# Patient Record
Sex: Female | Born: 1955
Health system: Southern US, Community
[De-identification: ages and names within clinical notes are randomized; demographics above are authoritative.]

## PROBLEM LIST (undated history)

## (undated) DIAGNOSIS — Z860100 Personal history of colon polyps, unspecified: Secondary | ICD-10-CM

## (undated) DIAGNOSIS — K219 Gastro-esophageal reflux disease without esophagitis: Secondary | ICD-10-CM

## (undated) DIAGNOSIS — K8020 Calculus of gallbladder without cholecystitis without obstruction: Secondary | ICD-10-CM

## (undated) DIAGNOSIS — F419 Anxiety disorder, unspecified: Secondary | ICD-10-CM

## (undated) DIAGNOSIS — Z800 Family history of malignant neoplasm of digestive organs: Secondary | ICD-10-CM

## (undated) DIAGNOSIS — N390 Urinary tract infection, site not specified: Secondary | ICD-10-CM

## (undated) DIAGNOSIS — K589 Irritable bowel syndrome without diarrhea: Secondary | ICD-10-CM

## (undated) DIAGNOSIS — E739 Lactose intolerance, unspecified: Secondary | ICD-10-CM

## (undated) DIAGNOSIS — Z8041 Family history of malignant neoplasm of ovary: Secondary | ICD-10-CM

## (undated) DIAGNOSIS — E039 Hypothyroidism, unspecified: Secondary | ICD-10-CM

## (undated) HISTORY — DX: Family history of malignant neoplasm of ovary: Z80.41

## (undated) HISTORY — DX: Personal history of colonic polyps: Z86.010

## (undated) HISTORY — DX: Hypothyroidism, unspecified: E03.9

## (undated) HISTORY — PX: ABDOMINAL HYSTERECTOMY: SHX81

## (undated) HISTORY — DX: Anxiety disorder, unspecified: F41.9

## (undated) HISTORY — PX: INGUINAL HERNIA REPAIR: SUR1180

## (undated) HISTORY — DX: Gastro-esophageal reflux disease without esophagitis: K21.9

## (undated) HISTORY — DX: Irritable bowel syndrome, unspecified: K58.9

## (undated) HISTORY — DX: Urinary tract infection, site not specified: N39.0

## (undated) HISTORY — DX: Lactose intolerance, unspecified: E73.9

## (undated) HISTORY — DX: Calculus of gallbladder without cholecystitis without obstruction: K80.20

## (undated) HISTORY — PX: CHOLECYSTECTOMY: SHX55

## (undated) HISTORY — DX: Family history of malignant neoplasm of digestive organs: Z80.0

## (undated) HISTORY — DX: Personal history of colon polyps, unspecified: Z86.0100

---

## 1983-05-05 HISTORY — PX: PARTIAL HYSTERECTOMY: SHX80

## 1997-05-04 HISTORY — PX: RIGHT OOPHORECTOMY: SHX2359

## 1997-06-28 ENCOUNTER — Ambulatory Visit (HOSPITAL_COMMUNITY): Admission: RE | Admit: 1997-06-28 | Discharge: 1997-06-28 | Payer: Self-pay | Admitting: Obstetrics & Gynecology

## 1998-06-13 ENCOUNTER — Other Ambulatory Visit: Admission: RE | Admit: 1998-06-13 | Discharge: 1998-06-13 | Payer: Self-pay | Admitting: Obstetrics & Gynecology

## 2002-07-14 ENCOUNTER — Other Ambulatory Visit: Admission: RE | Admit: 2002-07-14 | Discharge: 2002-07-14 | Payer: Self-pay | Admitting: Obstetrics & Gynecology

## 2003-07-19 ENCOUNTER — Other Ambulatory Visit: Admission: RE | Admit: 2003-07-19 | Discharge: 2003-07-19 | Payer: Self-pay | Admitting: Obstetrics & Gynecology

## 2004-07-29 ENCOUNTER — Other Ambulatory Visit: Admission: RE | Admit: 2004-07-29 | Discharge: 2004-07-29 | Payer: Self-pay | Admitting: Obstetrics & Gynecology

## 2005-08-17 ENCOUNTER — Other Ambulatory Visit: Admission: RE | Admit: 2005-08-17 | Discharge: 2005-08-17 | Payer: Self-pay | Admitting: Obstetrics & Gynecology

## 2010-04-29 ENCOUNTER — Encounter (INDEPENDENT_AMBULATORY_CARE_PROVIDER_SITE_OTHER): Payer: Self-pay | Admitting: *Deleted

## 2010-05-01 ENCOUNTER — Encounter (INDEPENDENT_AMBULATORY_CARE_PROVIDER_SITE_OTHER): Payer: Self-pay | Admitting: *Deleted

## 2010-05-06 ENCOUNTER — Ambulatory Visit
Admission: RE | Admit: 2010-05-06 | Discharge: 2010-05-06 | Payer: Self-pay | Source: Home / Self Care | Attending: Gastroenterology | Admitting: Gastroenterology

## 2010-06-05 NOTE — Letter (Signed)
Summary: Moviprep Instructions  [facility]  [address]   [place], [place] [address]   Phone: [phone]  Fax: [phone]       DAPHYNE MIGUEZ    1955-11-26    MRN: [id number]        Procedure Day Dorna Bloom: Duanne Limerick  06/16/10     Arrival Time: 8:00 a.m.      Procedure Time: 9:00 a.m.     Location of Procedure:                    x   [facility] (4th Floor)   PREPARATION FOR COLONOSCOPY WITH MOVIPREP   Starting 5 days prior to your procedure  06/11/10 do not eat nuts, seeds, popcorn, corn, beans, peas,  salads, or any raw vegetables.  Do not take any fiber supplements (e.g. Metamucil, Citrucel, and Benefiber).  THE DAY BEFORE YOUR PROCEDURE         DATE: 06/15/10   DAY: SUNDAY  1.  Drink clear liquids the entire day-NO SOLID FOOD  2.  Do not drink anything colored red or purple.  Avoid juices with pulp.  No orange juice.  3.  Drink at least 64 oz. (8 glasses) of fluid/clear liquids during the day to prevent dehydration and help the prep work efficiently.  CLEAR LIQUIDS INCLUDE: Water Jello Ice Popsicles Tea (sugar ok, no milk/cream) Powdered fruit flavored drinks Coffee (sugar ok, no milk/cream) Gatorade Juice: apple, white grape, white cranberry  Lemonade Clear bullion, consomm, broth Carbonated beverages (any kind) Strained chicken noodle soup Hard Candy                             4.  In the morning, mix first dose of MoviPrep solution:    Empty 1 Pouch A and 1 Pouch B into the disposable container    Add lukewarm drinking water to the top line of the container. Mix to dissolve    Refrigerate (mixed solution should be used within 24 hrs)  5.  Begin drinking the prep at 5:00 p.m. The MoviPrep container is divided by 4 marks.   Every 15 minutes drink the solution down to the next mark (approximately 8 oz) until the full liter is complete.   6.  Follow completed prep with 16 oz of clear liquid of your choice (Nothing red or  purple).  Continue to drink clear liquids until bedtime.  7.  Before going to bed, mix second dose of MoviPrep solution:    Empty 1 Pouch A and 1 Pouch B into the disposable container    Add lukewarm drinking water to the top line of the container. Mix to dissolve    Refrigerate  THE DAY OF YOUR PROCEDURE      DATE: 06/16/10  DAY: MONDAY  Beginning at 4:00a.m. (5 hours before procedure):         1. Every 15 minutes, drink the solution down to the next mark (approx 8 oz) until the full liter is complete.  2. Follow completed prep with 16 oz. of clear liquid of your choice.    3. You may drink clear liquids until 7:00a.m. (2 HOURS BEFORE PROCEDURE).   MEDICATION INSTRUCTIONS  Unless otherwise instructed, you should take regular prescription medications with a small sip of water   as early as possible the morning of your procedure.       OTHER INSTRUCTIONS  You will need a responsible adult at  least 55 years of age to accompany you and drive you home.   This person must remain in the waiting room during your procedure.  Wear loose fitting clothing that is easily removed.  Leave jewelry and other valuables at home.  However, you may wish to bring a book to read or  an iPod/MP3 player to listen to music as you wait for your procedure to start.  Remove all body piercing jewelry and leave at home.  Total time from sign-in until discharge is approximately 2-3 hours.  You should go home directly after your procedure and rest.  You can resume normal activities the  day after your procedure.  The day of your procedure you should not:   Drive   Make legal decisions   Operate machinery   Drink alcohol   Return to work  You will receive specific instructions about eating, activities and medications before you leave.    The above instructions have been reviewed and explained to me by   Wyona Almas RN  May 06, 2010 10:58 AM     I fully understand and can  verbalize these instructions _____________________________ Date _________

## 2010-06-05 NOTE — Miscellaneous (Signed)
Summary: [facility] Previsit/prep  Clinical Lists Changes  Medications: Added new medication of MOVIPREP 100 GM  SOLR (PEG-KCL-NACL-NASULF-NA ASC-C) As per prep instructions. - Signed Rx of MOVIPREP 100 GM  SOLR (PEG-KCL-NACL-NASULF-NA ASC-C) As per prep instructions.;  #1 x 0;  Signed;  Entered by: Wyona Almas RN;  Authorized by: Meryl Dare MD [facility];  Method used: Electron[facility] to [facility] #[id number] [address] [place], [place]  [address], Ph: [phone], Fax: [phone] Allergies: Added new allergy or adverse reaction of CODEINE Observations: Added new observation of NKA: F (05/06/2010 10:18)    Prescriptions: MOVIPREP 100 GM  SOLR (PEG-KCL-NACL-NASULF-NA ASC-C) As per prep instructions.  #1 x 0   Entered by:   Wyona Almas RN   Authorized by:   Meryl Dare MD [facility]   Signed by:   Wyona Almas RN on 05/06/2010   Method used:   [facility]ronically to        [id number] (retail)       [address]       [place], [place]  [address]       Ph: [phone]       Fax: [phone]   RxID:   [id number]

## 2010-06-05 NOTE — Letter (Signed)
Summary: Pre Visit Letter Revised  [facility]  [address] [place], [place] [address]   Phone: [phone]  Fax: [phone]        04/29/2010 MRN: [id number] BREALYNN CONTINO [address] [place], [place]  [address]             Procedure Date:  May 20, 2010   Welcome to the Gastroenterology Division at [facility].    You are scheduled to see a nurse for your pre-procedure visit on May 06, 2010 at 10:30am on the 3rd floor at [facility], [address]  We ask that you try to arrive at our office 15 minutes prior to your appointment time to allow for check-in.  Please take a minute to review the attached form.  If you answer "Yes" to one or more of the questions on the first page, we ask that you call the person listed at your earliest opportunity.  If you answer "No" to all of the questions, please complete the rest of the form and bring it to your appointment.    Your nurse visit will consist of discussing your medical and surgical history, your immediate family medical history, and your medications.   If you are unable to list all of your medications on the form, please bring the medication bottles to your appointment and we will list them.  We will need to be aware of both prescribed and over the counter drugs.  We will need to know exact dosage information as well.    Please be prepared to read and sign documents such as consent forms, a financial agreement, and acknowledgement forms.  If necessary, and with your consent, a friend or relative is welcome to sit-in on the nurse visit with you.  Please bring your insurance card so that we may make a copy of it.  If your insurance requires a referral to see a specialist, please bring your referral form from your primary care physician.  No co-pay is required for this nurse visit.     If you cannot keep your appointment, please call [phone] to cancel or reschedule prior to your appointment  date.  This allows [place] the opportunity to schedule an appointment for another patient in need of care.    Thank you for choosing [facility] for your medical needs.  We appreciate the opportunity to care for you.  Please visit [place] at our website  to learn more about our practice.  Sincerely, The Gastroenterology Division

## 2011-07-22 ENCOUNTER — Institutional Professional Consult (permissible substitution): Payer: Self-pay | Admitting: Cardiology

## 2011-08-17 ENCOUNTER — Institutional Professional Consult (permissible substitution): Payer: Self-pay | Admitting: Cardiovascular Disease

## 2014-06-25 HISTORY — PX: COLONOSCOPY: SHX174

## 2015-10-02 DIAGNOSIS — Z01419 Encounter for gynecological examination (general) (routine) without abnormal findings: Secondary | ICD-10-CM | POA: Diagnosis not present

## 2015-10-02 DIAGNOSIS — Z13820 Encounter for screening for osteoporosis: Secondary | ICD-10-CM | POA: Diagnosis not present

## 2015-10-02 DIAGNOSIS — Z6826 Body mass index (BMI) 26.0-26.9, adult: Secondary | ICD-10-CM | POA: Diagnosis not present

## 2015-10-02 DIAGNOSIS — Z1231 Encounter for screening mammogram for malignant neoplasm of breast: Secondary | ICD-10-CM | POA: Diagnosis not present

## 2015-10-24 DIAGNOSIS — R590 Localized enlarged lymph nodes: Secondary | ICD-10-CM | POA: Diagnosis not present

## 2015-10-24 DIAGNOSIS — M79604 Pain in right leg: Secondary | ICD-10-CM | POA: Diagnosis not present

## 2015-10-24 DIAGNOSIS — R609 Edema, unspecified: Secondary | ICD-10-CM | POA: Diagnosis not present

## 2015-10-24 DIAGNOSIS — K4091 Unilateral inguinal hernia, without obstruction or gangrene, recurrent: Secondary | ICD-10-CM | POA: Diagnosis not present

## 2016-01-14 DIAGNOSIS — M1612 Unilateral primary osteoarthritis, left hip: Secondary | ICD-10-CM | POA: Diagnosis not present

## 2016-01-14 DIAGNOSIS — Z0000 Encounter for general adult medical examination without abnormal findings: Secondary | ICD-10-CM | POA: Diagnosis not present

## 2016-01-14 DIAGNOSIS — Z0001 Encounter for general adult medical examination with abnormal findings: Secondary | ICD-10-CM | POA: Diagnosis not present

## 2016-01-14 DIAGNOSIS — Z1389 Encounter for screening for other disorder: Secondary | ICD-10-CM | POA: Diagnosis not present

## 2016-03-12 DIAGNOSIS — H43812 Vitreous degeneration, left eye: Secondary | ICD-10-CM | POA: Diagnosis not present

## 2016-06-29 DIAGNOSIS — M542 Cervicalgia: Secondary | ICD-10-CM | POA: Diagnosis not present

## 2016-06-29 DIAGNOSIS — R42 Dizziness and giddiness: Secondary | ICD-10-CM | POA: Diagnosis not present

## 2016-08-20 DIAGNOSIS — R1013 Epigastric pain: Secondary | ICD-10-CM | POA: Diagnosis not present

## 2016-08-20 DIAGNOSIS — Z6825 Body mass index (BMI) 25.0-25.9, adult: Secondary | ICD-10-CM | POA: Diagnosis not present

## 2016-08-20 DIAGNOSIS — K210 Gastro-esophageal reflux disease with esophagitis: Secondary | ICD-10-CM | POA: Diagnosis not present

## 2016-10-29 DIAGNOSIS — Z6826 Body mass index (BMI) 26.0-26.9, adult: Secondary | ICD-10-CM | POA: Diagnosis not present

## 2016-10-29 DIAGNOSIS — N760 Acute vaginitis: Secondary | ICD-10-CM | POA: Diagnosis not present

## 2016-10-29 DIAGNOSIS — Z1231 Encounter for screening mammogram for malignant neoplasm of breast: Secondary | ICD-10-CM | POA: Diagnosis not present

## 2016-10-29 DIAGNOSIS — Z01419 Encounter for gynecological examination (general) (routine) without abnormal findings: Secondary | ICD-10-CM | POA: Diagnosis not present

## 2017-01-05 DIAGNOSIS — Z6826 Body mass index (BMI) 26.0-26.9, adult: Secondary | ICD-10-CM | POA: Diagnosis not present

## 2017-01-05 DIAGNOSIS — K219 Gastro-esophageal reflux disease without esophagitis: Secondary | ICD-10-CM | POA: Diagnosis not present

## 2017-01-05 DIAGNOSIS — L509 Urticaria, unspecified: Secondary | ICD-10-CM | POA: Diagnosis not present

## 2017-01-07 DIAGNOSIS — K219 Gastro-esophageal reflux disease without esophagitis: Secondary | ICD-10-CM | POA: Diagnosis not present

## 2017-01-07 DIAGNOSIS — Z23 Encounter for immunization: Secondary | ICD-10-CM | POA: Diagnosis not present

## 2017-01-07 DIAGNOSIS — L501 Idiopathic urticaria: Secondary | ICD-10-CM | POA: Diagnosis not present

## 2017-01-07 DIAGNOSIS — E039 Hypothyroidism, unspecified: Secondary | ICD-10-CM | POA: Diagnosis not present

## 2017-02-23 DIAGNOSIS — R1012 Left upper quadrant pain: Secondary | ICD-10-CM | POA: Diagnosis not present

## 2017-02-23 DIAGNOSIS — K219 Gastro-esophageal reflux disease without esophagitis: Secondary | ICD-10-CM | POA: Diagnosis not present

## 2017-03-03 DIAGNOSIS — K449 Diaphragmatic hernia without obstruction or gangrene: Secondary | ICD-10-CM | POA: Diagnosis not present

## 2017-03-03 DIAGNOSIS — K2900 Acute gastritis without bleeding: Secondary | ICD-10-CM | POA: Diagnosis not present

## 2017-03-03 DIAGNOSIS — Z79899 Other long term (current) drug therapy: Secondary | ICD-10-CM | POA: Diagnosis not present

## 2017-03-03 DIAGNOSIS — K219 Gastro-esophageal reflux disease without esophagitis: Secondary | ICD-10-CM | POA: Diagnosis not present

## 2017-03-03 DIAGNOSIS — D131 Benign neoplasm of stomach: Secondary | ICD-10-CM | POA: Diagnosis not present

## 2017-03-03 DIAGNOSIS — K2950 Unspecified chronic gastritis without bleeding: Secondary | ICD-10-CM | POA: Diagnosis not present

## 2017-03-03 DIAGNOSIS — K317 Polyp of stomach and duodenum: Secondary | ICD-10-CM | POA: Diagnosis not present

## 2017-03-03 DIAGNOSIS — R1012 Left upper quadrant pain: Secondary | ICD-10-CM | POA: Diagnosis not present

## 2017-03-03 DIAGNOSIS — Z9049 Acquired absence of other specified parts of digestive tract: Secondary | ICD-10-CM | POA: Diagnosis not present

## 2017-03-03 DIAGNOSIS — R1013 Epigastric pain: Secondary | ICD-10-CM | POA: Diagnosis not present

## 2017-03-03 DIAGNOSIS — K581 Irritable bowel syndrome with constipation: Secondary | ICD-10-CM | POA: Diagnosis not present

## 2017-03-03 DIAGNOSIS — K209 Esophagitis, unspecified: Secondary | ICD-10-CM | POA: Diagnosis not present

## 2017-03-03 DIAGNOSIS — E039 Hypothyroidism, unspecified: Secondary | ICD-10-CM | POA: Diagnosis not present

## 2017-03-03 DIAGNOSIS — B9681 Helicobacter pylori [H. pylori] as the cause of diseases classified elsewhere: Secondary | ICD-10-CM | POA: Diagnosis not present

## 2017-03-03 HISTORY — PX: ESOPHAGOGASTRODUODENOSCOPY: SHX1529

## 2017-05-24 DIAGNOSIS — M25551 Pain in right hip: Secondary | ICD-10-CM | POA: Diagnosis not present

## 2017-05-24 DIAGNOSIS — M79671 Pain in right foot: Secondary | ICD-10-CM | POA: Diagnosis not present

## 2017-05-24 DIAGNOSIS — Z6826 Body mass index (BMI) 26.0-26.9, adult: Secondary | ICD-10-CM | POA: Diagnosis not present

## 2017-06-08 DIAGNOSIS — M545 Low back pain: Secondary | ICD-10-CM | POA: Diagnosis not present

## 2017-06-08 DIAGNOSIS — M25551 Pain in right hip: Secondary | ICD-10-CM | POA: Diagnosis not present

## 2017-06-11 DIAGNOSIS — M25651 Stiffness of right hip, not elsewhere classified: Secondary | ICD-10-CM | POA: Diagnosis not present

## 2017-06-11 DIAGNOSIS — R2689 Other abnormalities of gait and mobility: Secondary | ICD-10-CM | POA: Diagnosis not present

## 2017-06-11 DIAGNOSIS — M25551 Pain in right hip: Secondary | ICD-10-CM | POA: Diagnosis not present

## 2017-06-29 DIAGNOSIS — R1031 Right lower quadrant pain: Secondary | ICD-10-CM | POA: Diagnosis not present

## 2017-06-29 DIAGNOSIS — M25551 Pain in right hip: Secondary | ICD-10-CM | POA: Diagnosis not present

## 2017-07-05 DIAGNOSIS — R1031 Right lower quadrant pain: Secondary | ICD-10-CM | POA: Diagnosis not present

## 2017-07-05 DIAGNOSIS — M25551 Pain in right hip: Secondary | ICD-10-CM | POA: Diagnosis not present

## 2017-11-30 DIAGNOSIS — Z6826 Body mass index (BMI) 26.0-26.9, adult: Secondary | ICD-10-CM | POA: Diagnosis not present

## 2017-11-30 DIAGNOSIS — Z1231 Encounter for screening mammogram for malignant neoplasm of breast: Secondary | ICD-10-CM | POA: Diagnosis not present

## 2017-11-30 DIAGNOSIS — N390 Urinary tract infection, site not specified: Secondary | ICD-10-CM | POA: Diagnosis not present

## 2017-11-30 DIAGNOSIS — B373 Candidiasis of vulva and vagina: Secondary | ICD-10-CM | POA: Diagnosis not present

## 2017-11-30 DIAGNOSIS — Z01419 Encounter for gynecological examination (general) (routine) without abnormal findings: Secondary | ICD-10-CM | POA: Diagnosis not present

## 2017-11-30 DIAGNOSIS — Z13820 Encounter for screening for osteoporosis: Secondary | ICD-10-CM | POA: Diagnosis not present

## 2017-12-02 DIAGNOSIS — M26629 Arthralgia of temporomandibular joint, unspecified side: Secondary | ICD-10-CM | POA: Diagnosis not present

## 2017-12-02 DIAGNOSIS — Z6826 Body mass index (BMI) 26.0-26.9, adult: Secondary | ICD-10-CM | POA: Diagnosis not present

## 2017-12-11 DIAGNOSIS — M545 Low back pain, unspecified: Secondary | ICD-10-CM | POA: Insufficient documentation

## 2017-12-11 DIAGNOSIS — M25551 Pain in right hip: Secondary | ICD-10-CM | POA: Diagnosis not present

## 2018-01-18 DIAGNOSIS — Z0001 Encounter for general adult medical examination with abnormal findings: Secondary | ICD-10-CM | POA: Diagnosis not present

## 2018-01-18 DIAGNOSIS — Z23 Encounter for immunization: Secondary | ICD-10-CM | POA: Diagnosis not present

## 2018-01-18 DIAGNOSIS — Z13220 Encounter for screening for lipoid disorders: Secondary | ICD-10-CM | POA: Diagnosis not present

## 2018-01-18 DIAGNOSIS — Z1331 Encounter for screening for depression: Secondary | ICD-10-CM | POA: Diagnosis not present

## 2018-01-18 DIAGNOSIS — N3289 Other specified disorders of bladder: Secondary | ICD-10-CM | POA: Diagnosis not present

## 2018-06-27 DIAGNOSIS — J40 Bronchitis, not specified as acute or chronic: Secondary | ICD-10-CM | POA: Diagnosis not present

## 2018-06-27 DIAGNOSIS — J329 Chronic sinusitis, unspecified: Secondary | ICD-10-CM | POA: Diagnosis not present

## 2018-09-19 DIAGNOSIS — F4322 Adjustment disorder with anxiety: Secondary | ICD-10-CM | POA: Diagnosis not present

## 2018-09-19 DIAGNOSIS — Z6826 Body mass index (BMI) 26.0-26.9, adult: Secondary | ICD-10-CM | POA: Diagnosis not present

## 2018-09-19 DIAGNOSIS — N3000 Acute cystitis without hematuria: Secondary | ICD-10-CM | POA: Diagnosis not present

## 2018-10-13 DIAGNOSIS — R635 Abnormal weight gain: Secondary | ICD-10-CM | POA: Diagnosis not present

## 2018-10-13 DIAGNOSIS — F4322 Adjustment disorder with anxiety: Secondary | ICD-10-CM | POA: Diagnosis not present

## 2018-10-13 DIAGNOSIS — N3000 Acute cystitis without hematuria: Secondary | ICD-10-CM | POA: Diagnosis not present

## 2018-10-13 DIAGNOSIS — B373 Candidiasis of vulva and vagina: Secondary | ICD-10-CM | POA: Diagnosis not present

## 2019-01-27 ENCOUNTER — Encounter: Payer: Self-pay | Admitting: Gastroenterology

## 2019-02-14 ENCOUNTER — Encounter: Payer: Self-pay | Admitting: Gastroenterology

## 2019-02-22 ENCOUNTER — Encounter: Payer: Self-pay | Admitting: Gastroenterology

## 2019-02-22 ENCOUNTER — Other Ambulatory Visit: Payer: Self-pay

## 2019-02-22 ENCOUNTER — Ambulatory Visit: Payer: 59 | Admitting: Gastroenterology

## 2019-02-22 VITALS — BP 116/80 | HR 87 | Temp 98.1°F | Ht 64.5 in | Wt 149.5 lb

## 2019-02-22 DIAGNOSIS — R1319 Other dysphagia: Secondary | ICD-10-CM

## 2019-02-22 DIAGNOSIS — R1310 Dysphagia, unspecified: Secondary | ICD-10-CM

## 2019-02-22 DIAGNOSIS — R1011 Right upper quadrant pain: Secondary | ICD-10-CM

## 2019-02-22 DIAGNOSIS — R1012 Left upper quadrant pain: Secondary | ICD-10-CM | POA: Diagnosis not present

## 2019-02-22 DIAGNOSIS — K219 Gastro-esophageal reflux disease without esophagitis: Secondary | ICD-10-CM | POA: Diagnosis not present

## 2019-02-22 DIAGNOSIS — Z800 Family history of malignant neoplasm of digestive organs: Secondary | ICD-10-CM

## 2019-02-22 MED ORDER — PANTOPRAZOLE SODIUM 40 MG PO TBEC: 40.0000 mg | DELAYED_RELEASE_TABLET | Freq: Every day | ORAL | 11 refills | Status: DC

## 2019-02-22 NOTE — Progress Notes (Signed)
Chief Complaint: FU  Referring Provider:  Serita Grammes, MD      ASSESSMENT AND PLAN;   #1. GERD with eso dysphagia/hoarseness. D/d includes eso stricture, Schatzki's ring, motility disorder, eosinophilic esophagitis, pill induced esophagitis, r/o esophageal carcinoma or extrinsic lesions. #2. RUQ/LUQ pain #3. FH colon cancer in a second-degree relative (GM age 63)  Plan: -Protonix 40 mg p.o. QD, 1/2 hr before breakfast. #30.  11 refills. -Barium swallow with barium tablet. -CBC, CMP, lipase (patient wants to get it done at [facility] 6' blocks -EGD with eso bx and possibly dil/ colon Jan 2021  Discussed risks and benefits. -I have instructed patient to chew foods especially meats and breads well and eat slowly. -If still with problems and the above WU is negative, proceed with CT scan abdo/pelvis. -If still continues to have problems with hoarseness, ENT consultation.  HPI:    Cindy Ortiz is a 63 y.o. female  Has been having dysphagia x 1 year, progressively getting worse.  Initially started with solids now also with liquids.  This is associated with hoarseness.  No nausea or self-induced vomiting.  She has been using over-the-counter Prilosec but still has heartburn.  No melena or hematochezia.  No fever chills or night sweats.  No weight loss.  Also having some left upper quadrant discomfort and occasional right upper quadrant discomfort.  Could not identify any definite exacerbating or relieving factors except that bowel movements would relieve left upper quadrant abdominal discomfort at times.  Also complains of occasional constipation occ rectal bleed attributed to hemorrhoids.  Denies any use of nonsteroidals.  Past GI procedures: -EGD 02/2017 small HH.  Incidental gastric polyps s/p polypectomy. Neg eso bx for EoE, hyperplastic. Pos HP -colon 06/2014 (PCF) mild div.  SH-works as a Statistician.  2 boys. Past Medical  History:  Diagnosis Date  . Gallstones   . GERD (gastroesophageal reflux disease)   . History of colon polyps   . Hypothyroidism   . IBS (irritable bowel syndrome)    w/ Constipation  . Lactose intolerance   . UTI (urinary tract infection)     Past Surgical History:  Procedure Laterality Date  . CHOLECYSTECTOMY    . COLONOSCOPY  06/25/2014   Mild diverticulosis. Otherwise normal colonoscopy to TI.   Marland Kitchen ESOPHAGOGASTRODUODENOSCOPY  03/03/2017   Small transient hiatal hernia. Gastritis. Incidental polyps status post polypectomy.   . INGUINAL HERNIA REPAIR    . PARTIAL HYSTERECTOMY    . RIGHT OOPHORECTOMY  05/1997    Family History  Problem Relation Age of Onset  . Ovarian cancer Maternal Grandmother   . Diverticulitis Brother   . Colon cancer Neg Hx   . Esophageal cancer Neg Hx     Social History   Tobacco Use  . Smoking status: Never Smoker  . Smokeless tobacco: Never Used  Substance Use Topics  . Alcohol use: Not Currently  . Drug use: Not Currently    Current Outpatient Medications  Medication Sig Dispense Refill  . ALPRAZolam (XANAX) 0.5 MG tablet Take 0.5 mg by mouth as needed.    Marland Kitchen estradiol (ESTRACE) 1 MG tablet Take 1 mg by mouth daily.    Marland Kitchen levothyroxine (SYNTHROID) 75 MCG tablet 1 tablet daily.     No current facility-administered medications for this visit.     Allergies  Allergen Reactions  . Codeine     REACTION: Temporary blindness    Review of Systems:  Constitutional: Denies fever, chills,  diaphoresis, appetite change and fatigue.  HEENT: Denies photophobia, eye pain, redness, hearing loss, ear pain, congestion, sore throat, rhinorrhea, sneezing, mouth sores, neck pain, neck stiffness and tinnitus.   Respiratory: Denies SOB, DOE, cough, chest tightness,  and wheezing.   Cardiovascular: Denies chest pain, palpitations and leg swelling.  Genitourinary: Denies dysuria, urgency, frequency, hematuria, flank pain and difficulty urinating.   Musculoskeletal: Denies myalgias, back pain, joint swelling, arthralgias and gait problem.  Skin: No rash.  Neurological: Denies dizziness, seizures, syncope, weakness, light-headedness, numbness and headaches.  Hematological: Denies adenopathy. Easy bruising, personal or family bleeding history  Psychiatric/Behavioral: No anxiety or depression     Physical Exam:    BP 116/80   Pulse 87   Temp 98.1 F (36.7 C)   Ht 5' 4.5" (1.638 m)   Wt 149 lb 8 oz (67.8 kg)   BMI 25.27 kg/m  Filed Weights   02/22/19 0847  Weight: 149 lb 8 oz (67.8 kg)   Constitutional:  Well-developed, in no acute distress. Psychiatric: Normal mood and affect. Behavior is normal. HEENT: Pupils normal.  Conjunctivae are normal. No scleral icterus. Neck supple.  Cardiovascular: Normal rate, regular rhythm. No edema Pulmonary/chest: Effort normal and breath sounds normal. No wheezing, rales or rhonchi. Abdominal: Soft, nondistended. Nontender. Bowel sounds active throughout. There are no masses palpable. No hepatomegaly. Rectal:  defered Neurological: Alert and oriented to person place and time. Skin: Skin is warm and dry. No rashes noted.  Data Reviewed: I have personally reviewed following labs and imaging studies 25 minutes spent with the patient today. Greater than 50% was spent in counseling and coordination of care with the patient    Edman Circle, MD 02/22/2019, 9:08 AM  Cc: Buckner Malta, MD

## 2019-02-22 NOTE — Patient Instructions (Signed)
If you are age 63 or older, your body mass index should be between 23-30. Your Body mass index is 25.27 kg/m. If this is out of the aforementioned range listed, please consider follow up with your Primary Care Provider.  If you are age 64 or younger, your body mass index should be between 19-25. Your Body mass index is 25.27 kg/m. If this is out of the aformentioned range listed, please consider follow up with your Primary Care Provider.    We have sent the following medications to your pharmacy for you to pick up at your convenience: Protonix  Raise the head of your bed with 6 inch blocks.  Have the following labs drawn at your primary care doctors office: CBC,CMP,Lipase  You have been scheduled for a Barium Esophogram at [facility] (1st floor of the hospital) on 03/01/19 at 10:30am. Please arrive 15 minutes prior to your appointment for registration. Make certain not to have anything to eat or drink 3 hours prior to your test. If you need to reschedule for any reason, please contact radiology at [phone] to do so. __________________________________________________________________ A barium swallow is an examination that concentrates on views of the esophagus. This tends to be a double contrast exam (barium and two liquids which, when combined, create a gas to distend the wall of the oesophagus) or single contrast (non-ionic iodine based). The study is usually tailored to your symptoms so a good history is essential. Attention is paid during the study to the form, structure and configuration of the esophagus, looking for functional disorders (such as aspiration, dysphagia, achalasia, motility and reflux) EXAMINATION You may be asked to change into a gown, depending on the type of swallow being performed. A radiologist and radiographer will perform the procedure. The radiologist will advise you of the type of contrast selected for your procedure and direct you during the exam. You  will be asked to stand, sit or lie in several different positions and to hold a small amount of fluid in your mouth before being asked to swallow while the imaging is performed .In some instances you may be asked to swallow barium coated marshmallows to assess the motility of a solid food bolus. The exam can be recorded as a digital or video fluoroscopy procedure. POST PROCEDURE It will take 1-2 days for the barium to pass through your system. To facilitate this, it is important, unless otherwise directed, to increase your fluids for the next 24-48hrs and to resume your normal diet.  This test typically takes about 30 minutes to perform. __________________________________________________________________________________  Thank you,  Dr. Jackquline Denmark

## 2019-03-01 ENCOUNTER — Ambulatory Visit (HOSPITAL_COMMUNITY)
Admission: RE | Admit: 2019-03-01 | Discharge: 2019-03-01 | Disposition: A | Discharge status: Home / Self Care | Payer: 59 | Source: Ambulatory Visit | Attending: Gastroenterology | Admitting: Gastroenterology

## 2019-03-01 ENCOUNTER — Other Ambulatory Visit: Payer: Self-pay

## 2019-03-01 DIAGNOSIS — R1310 Dysphagia, unspecified: Secondary | ICD-10-CM | POA: Diagnosis present

## 2019-03-01 DIAGNOSIS — K219 Gastro-esophageal reflux disease without esophagitis: Secondary | ICD-10-CM | POA: Diagnosis present

## 2019-03-01 DIAGNOSIS — R1319 Other dysphagia: Secondary | ICD-10-CM

## 2019-03-06 ENCOUNTER — Telehealth: Payer: Self-pay

## 2019-03-06 NOTE — Telephone Encounter (Signed)
BUN 11 mg/dL Crea 0.9 mg/dL BUN/CREA 13 Ratio

## 2019-03-10 NOTE — Progress Notes (Signed)
ENT referral appt received-patient has been scheduled with Dr. Laurance Flatten on 05/09/2019 at 8:30 am;

## 2019-06-21 DIAGNOSIS — R490 Dysphonia: Secondary | ICD-10-CM | POA: Insufficient documentation

## 2019-07-18 DIAGNOSIS — Z79899 Other long term (current) drug therapy: Secondary | ICD-10-CM | POA: Diagnosis not present

## 2019-07-18 DIAGNOSIS — Z131 Encounter for screening for diabetes mellitus: Secondary | ICD-10-CM | POA: Diagnosis not present

## 2019-07-18 DIAGNOSIS — E785 Hyperlipidemia, unspecified: Secondary | ICD-10-CM | POA: Diagnosis not present

## 2019-07-18 DIAGNOSIS — E039 Hypothyroidism, unspecified: Secondary | ICD-10-CM | POA: Diagnosis not present

## 2019-07-18 DIAGNOSIS — K219 Gastro-esophageal reflux disease without esophagitis: Secondary | ICD-10-CM | POA: Diagnosis not present

## 2019-07-19 DIAGNOSIS — Z23 Encounter for immunization: Secondary | ICD-10-CM | POA: Diagnosis not present

## 2019-07-25 DIAGNOSIS — E049 Nontoxic goiter, unspecified: Secondary | ICD-10-CM | POA: Diagnosis not present

## 2019-07-28 ENCOUNTER — Ambulatory Visit: Payer: BC Managed Care – PPO | Admitting: Gastroenterology

## 2019-07-28 ENCOUNTER — Other Ambulatory Visit: Payer: Self-pay

## 2019-07-28 ENCOUNTER — Ambulatory Visit: Payer: 59 | Admitting: Gastroenterology

## 2019-07-28 ENCOUNTER — Encounter: Payer: Self-pay | Admitting: Gastroenterology

## 2019-07-28 VITALS — BP 114/82 | HR 88 | Temp 98.2°F | Ht 64.0 in | Wt 137.4 lb

## 2019-07-28 DIAGNOSIS — Z800 Family history of malignant neoplasm of digestive organs: Secondary | ICD-10-CM

## 2019-07-28 DIAGNOSIS — R1319 Other dysphagia: Secondary | ICD-10-CM

## 2019-07-28 DIAGNOSIS — R1012 Left upper quadrant pain: Secondary | ICD-10-CM

## 2019-07-28 DIAGNOSIS — R1011 Right upper quadrant pain: Secondary | ICD-10-CM

## 2019-07-28 DIAGNOSIS — R634 Abnormal weight loss: Secondary | ICD-10-CM

## 2019-07-28 DIAGNOSIS — R1310 Dysphagia, unspecified: Secondary | ICD-10-CM

## 2019-07-28 DIAGNOSIS — Z01818 Encounter for other preprocedural examination: Secondary | ICD-10-CM

## 2019-07-28 DIAGNOSIS — K219 Gastro-esophageal reflux disease without esophagitis: Secondary | ICD-10-CM

## 2019-07-28 MED ORDER — PANTOPRAZOLE SODIUM 40 MG PO TBEC: 40.0000 mg | DELAYED_RELEASE_TABLET | Freq: Every day | ORAL | 3 refills | Status: DC

## 2019-07-28 MED ORDER — PANTOPRAZOLE SODIUM 40 MG PO TBEC: 40.0000 mg | DELAYED_RELEASE_TABLET | Freq: Every day | ORAL | 11 refills | Status: DC

## 2019-07-28 NOTE — Patient Instructions (Signed)
If you are age 64 or older, your body mass index should be between 23-30. Your Body mass index is 23.58 kg/m. If this is out of the aforementioned range listed, please consider follow up with your Primary Care Provider.  If you are age 3 or younger, your body mass index should be between 19-25. Your Body mass index is 23.58 kg/m. If this is out of the aformentioned range listed, please consider follow up with your Primary Care Provider.   We have sent the following medications to your pharmacy for you to pick up at your convenience: Protonix   You have been scheduled for an endoscopy and colonoscopy. Please follow the written instructions given to you at your visit today. Please pick up your prep supplies at the pharmacy within the next 1-3 days. If you use inhalers (even only as needed), please bring them with you on the day of your procedure. Your physician has requested that you go to www.startemmi.com and enter the access code given to you at your visit today. This web site gives a general overview about your procedure. However, you should still follow specific instructions given to you by our office regarding your preparation for the procedure.  Thank you,  Dr. Lynann Bologna

## 2019-07-28 NOTE — Progress Notes (Signed)
Chief Complaint: FU  Referring Provider:  Serita Grammes, MD      ASSESSMENT AND PLAN;   #1. GERD with eso dysphagia/hoarseness. D/d includes eso stricture, Schatzki's ring, motility disorder, EoE, pill induced esophagitis, r/o esophageal carcinoma or extrinsic lesions. Neg Ba swallow- neg. Neg CT neck. Neg ENT eval Dr Laurance Flatten #2. RUQ/LUQ pain #3. FH colon cancer in a second-degree relative (GM age 64) #4. Wt loss  Plan: -Increase Protonix 40 mg p.o. QD, 1/2 hr before breakfast. #60.  11 refills. -Raise HOB 6' blocks -EGD with eso bx and possibly dil/ colon.  Discussed risks and benefits. -I have instructed patient to chew foods especially meats and breads well and eat slowly. -If still with problems and the above WU is neg/or continue weight loss, proceed with CT scan abdo/pelvis. -If still continues to have problems with hoarseness, ENT consultation.  HPI:    Cindy Ortiz is a 64 y.o. female  Has been having dysphagia x 1 year, progressively getting worse.  Initially started with solids now also with liquids.  This is associated with hoarseness.  No nausea or self-induced vomiting.  She has been using over-the-counter Prilosec but still has heartburn.  No melena or hematochezia.  No fever chills or night sweats.   Seen by Dr. Laurance Flatten ENT-negative evaluation including nasopharyngoscopy except for an edema of the arytenoids, likely consistent with reflux.  Advised to increase Protonix to twice daily.  She had CT neck which was unremarkable.  Continues to have hoarseness.  Advised GI work-up.  Also having some left upper quadrant discomfort and occasional right upper quadrant discomfort.  Could not identify any definite exacerbating or relieving factors except that bowel movements would relieve left upper quadrant abdominal discomfort at times.  Also complains of occasional constipation occ rectal bleed attributed to hemorrhoids.  Denies any use of nonsteroidals.  Has  had weight loss as below  Wt Readings from Last 3 Encounters:  07/28/19 137 lb 6 oz (62.3 kg)  02/22/19 149 lb 8 oz (67.8 kg)     Past GI procedures: -EGD 02/2017 small HH.  Incidental gastric polyps s/p polypectomy. Neg eso bx for EoE, hyperplastic. Pos HP -colon 06/2014 (PCF) mild div.  SH-works as a Statistician.  2 boys. Past Medical History:  Diagnosis Date  . Family history of colon cancer   . Gallstones   . GERD (gastroesophageal reflux disease)   . History of colon polyps   . Hypothyroidism   . IBS (irritable bowel syndrome)    w/ Constipation  . Lactose intolerance   . UTI (urinary tract infection)     Past Surgical History:  Procedure Laterality Date  . CHOLECYSTECTOMY    . COLONOSCOPY  06/25/2014   Mild diverticulosis. Otherwise normal colonoscopy to TI.   Marland Kitchen ESOPHAGOGASTRODUODENOSCOPY  03/03/2017   Small transient hiatal hernia. Gastritis. Incidental polyps status post polypectomy.   . INGUINAL HERNIA REPAIR    . PARTIAL HYSTERECTOMY    . RIGHT OOPHORECTOMY  05/1997    Family History  Problem Relation Age of Onset  . Ovarian cancer Maternal Grandmother   . Diverticulitis Brother   . Colon cancer Neg Hx   . Esophageal cancer Neg Hx     Social History   Tobacco Use  . Smoking status: Never Smoker  . Smokeless tobacco: Never Used  Substance Use Topics  . Alcohol use: Not Currently  . Drug use: Not Currently    Current Outpatient Medications  Medication Sig Dispense  Refill  . ALPRAZolam (XANAX) 0.5 MG tablet Take 0.5 mg by mouth as needed.    Marland Kitchen estradiol (ESTRACE) 1 MG tablet Take 1 mg by mouth daily.    Marland Kitchen levothyroxine (SYNTHROID) 75 MCG tablet 1 tablet daily.    . pantoprazole (PROTONIX) 40 MG tablet Take 1 tablet (40 mg total) by mouth daily. 30 tablet 11   No current facility-administered medications for this visit.    Allergies  Allergen Reactions  . Codeine     REACTION: Temporary blindness    Review of  Systems:  neg Psychiatric/Behavioral: No anxiety or depression     Physical Exam:    BP 114/82   Pulse 88   Temp 98.2 F (36.8 C)   Ht 5\' 4"  (1.626 m)   Wt 137 lb 6 oz (62.3 kg)   BMI 23.58 kg/m  Filed Weights   07/28/19 1413  Weight: 137 lb 6 oz (62.3 kg)   Constitutional:  Well-developed, in no acute distress. Psychiatric: Normal mood and affect. Behavior is normal. HEENT: Pupils normal.  Conjunctivae are normal. No scleral icterus. Neck supple.  Cardiovascular: Normal rate, regular rhythm. No edema Pulmonary/chest: Effort normal and breath sounds normal. No wheezing, rales or rhonchi. Abdominal: Soft, nondistended. Nontender. Bowel sounds active throughout. There are no masses palpable. No hepatomegaly. Rectal:  defered Neurological: Alert and oriented to person place and time. Skin: Skin is warm and dry. No rashes noted.  Data Reviewed: I have personally reviewed following labs and imaging studies 25 minutes spent with the patient today. Greater than 50% was spent in counseling and coordination of care with the patient    07/30/19, MD 07/28/2019, 2:39 PM  Cc: 07/30/2019, MD

## 2019-07-29 DIAGNOSIS — E039 Hypothyroidism, unspecified: Secondary | ICD-10-CM | POA: Diagnosis not present

## 2019-07-29 DIAGNOSIS — T424X2A Poisoning by benzodiazepines, intentional self-harm, initial encounter: Secondary | ICD-10-CM | POA: Diagnosis not present

## 2019-07-29 DIAGNOSIS — W19XXXA Unspecified fall, initial encounter: Secondary | ICD-10-CM | POA: Diagnosis not present

## 2019-07-29 DIAGNOSIS — R918 Other nonspecific abnormal finding of lung field: Secondary | ICD-10-CM | POA: Diagnosis not present

## 2019-07-29 DIAGNOSIS — F411 Generalized anxiety disorder: Secondary | ICD-10-CM | POA: Diagnosis not present

## 2019-07-29 DIAGNOSIS — T50904A Poisoning by unspecified drugs, medicaments and biological substances, undetermined, initial encounter: Secondary | ICD-10-CM | POA: Diagnosis not present

## 2019-07-29 DIAGNOSIS — R7989 Other specified abnormal findings of blood chemistry: Secondary | ICD-10-CM | POA: Diagnosis not present

## 2019-07-29 DIAGNOSIS — Z043 Encounter for examination and observation following other accident: Secondary | ICD-10-CM | POA: Diagnosis not present

## 2019-07-29 DIAGNOSIS — K219 Gastro-esophageal reflux disease without esophagitis: Secondary | ICD-10-CM | POA: Diagnosis not present

## 2019-07-29 DIAGNOSIS — F331 Major depressive disorder, recurrent, moderate: Secondary | ICD-10-CM | POA: Diagnosis not present

## 2019-07-29 DIAGNOSIS — Z79899 Other long term (current) drug therapy: Secondary | ICD-10-CM | POA: Diagnosis not present

## 2019-07-29 DIAGNOSIS — R4182 Altered mental status, unspecified: Secondary | ICD-10-CM | POA: Diagnosis not present

## 2019-07-29 DIAGNOSIS — T424X1A Poisoning by benzodiazepines, accidental (unintentional), initial encounter: Secondary | ICD-10-CM | POA: Diagnosis not present

## 2019-07-30 DIAGNOSIS — R4182 Altered mental status, unspecified: Secondary | ICD-10-CM | POA: Diagnosis not present

## 2019-07-30 DIAGNOSIS — R918 Other nonspecific abnormal finding of lung field: Secondary | ICD-10-CM | POA: Diagnosis not present

## 2019-07-30 DIAGNOSIS — T424X4A Poisoning by benzodiazepines, undetermined, initial encounter: Secondary | ICD-10-CM | POA: Diagnosis not present

## 2019-07-30 DIAGNOSIS — F419 Anxiety disorder, unspecified: Secondary | ICD-10-CM | POA: Diagnosis not present

## 2019-07-30 DIAGNOSIS — K219 Gastro-esophageal reflux disease without esophagitis: Secondary | ICD-10-CM | POA: Insufficient documentation

## 2019-07-30 DIAGNOSIS — E039 Hypothyroidism, unspecified: Secondary | ICD-10-CM | POA: Insufficient documentation

## 2019-07-30 DIAGNOSIS — F329 Major depressive disorder, single episode, unspecified: Secondary | ICD-10-CM | POA: Diagnosis not present

## 2019-07-30 DIAGNOSIS — W19XXXA Unspecified fall, initial encounter: Secondary | ICD-10-CM | POA: Diagnosis not present

## 2019-07-30 DIAGNOSIS — T424X2A Poisoning by benzodiazepines, intentional self-harm, initial encounter: Secondary | ICD-10-CM | POA: Insufficient documentation

## 2019-07-30 DIAGNOSIS — F32A Depression, unspecified: Secondary | ICD-10-CM | POA: Insufficient documentation

## 2019-07-30 DIAGNOSIS — Z043 Encounter for examination and observation following other accident: Secondary | ICD-10-CM | POA: Diagnosis not present

## 2019-07-31 DIAGNOSIS — F331 Major depressive disorder, recurrent, moderate: Secondary | ICD-10-CM | POA: Diagnosis not present

## 2019-07-31 DIAGNOSIS — T50901A Poisoning by unspecified drugs, medicaments and biological substances, accidental (unintentional), initial encounter: Secondary | ICD-10-CM | POA: Diagnosis not present

## 2019-07-31 DIAGNOSIS — T50901D Poisoning by unspecified drugs, medicaments and biological substances, accidental (unintentional), subsequent encounter: Secondary | ICD-10-CM | POA: Diagnosis not present

## 2019-07-31 DIAGNOSIS — F411 Generalized anxiety disorder: Secondary | ICD-10-CM | POA: Insufficient documentation

## 2019-08-11 ENCOUNTER — Other Ambulatory Visit: Payer: Self-pay | Admitting: Gastroenterology

## 2019-08-11 ENCOUNTER — Ambulatory Visit (INDEPENDENT_AMBULATORY_CARE_PROVIDER_SITE_OTHER): Payer: BC Managed Care – PPO

## 2019-08-11 DIAGNOSIS — Z1159 Encounter for screening for other viral diseases: Secondary | ICD-10-CM | POA: Diagnosis not present

## 2019-08-11 LAB — SARS CORONAVIRUS 2 (TAT 6-24 HRS): SARS Coronavirus 2: NEGATIVE

## 2019-08-15 ENCOUNTER — Ambulatory Visit (AMBULATORY_SURGERY_CENTER): Payer: BC Managed Care – PPO | Admitting: Gastroenterology

## 2019-08-15 ENCOUNTER — Other Ambulatory Visit: Payer: Self-pay

## 2019-08-15 ENCOUNTER — Encounter: Payer: Self-pay | Admitting: Gastroenterology

## 2019-08-15 VITALS — BP 124/71 | HR 61 | Temp 97.7°F | Resp 14 | Ht 64.0 in | Wt 137.0 lb

## 2019-08-15 DIAGNOSIS — Z800 Family history of malignant neoplasm of digestive organs: Secondary | ICD-10-CM | POA: Diagnosis not present

## 2019-08-15 DIAGNOSIS — K317 Polyp of stomach and duodenum: Secondary | ICD-10-CM | POA: Diagnosis not present

## 2019-08-15 DIAGNOSIS — K228 Other specified diseases of esophagus: Secondary | ICD-10-CM | POA: Diagnosis not present

## 2019-08-15 DIAGNOSIS — R1310 Dysphagia, unspecified: Secondary | ICD-10-CM | POA: Diagnosis not present

## 2019-08-15 DIAGNOSIS — K449 Diaphragmatic hernia without obstruction or gangrene: Secondary | ICD-10-CM

## 2019-08-15 DIAGNOSIS — K3189 Other diseases of stomach and duodenum: Secondary | ICD-10-CM

## 2019-08-15 DIAGNOSIS — K2950 Unspecified chronic gastritis without bleeding: Secondary | ICD-10-CM | POA: Diagnosis not present

## 2019-08-15 DIAGNOSIS — K219 Gastro-esophageal reflux disease without esophagitis: Secondary | ICD-10-CM

## 2019-08-15 DIAGNOSIS — K2970 Gastritis, unspecified, without bleeding: Secondary | ICD-10-CM | POA: Diagnosis not present

## 2019-08-15 DIAGNOSIS — Z1211 Encounter for screening for malignant neoplasm of colon: Secondary | ICD-10-CM | POA: Diagnosis not present

## 2019-08-15 MED ORDER — SODIUM CHLORIDE 0.9 % IV SOLN: 500.0000 mL | Freq: Once | INTRAVENOUS | Status: DC

## 2019-08-15 NOTE — Progress Notes (Signed)
JB- temp CW- vitals 

## 2019-08-15 NOTE — Progress Notes (Signed)
Called to room to assist during endoscopic procedure.  Patient ID and intended procedure confirmed with present staff. Received instructions for my participation in the procedure from the performing physician.  

## 2019-08-15 NOTE — Op Note (Signed)
[facility] Patient Name: Cindy Ortiz Procedure Date: 08/15/2019 2:25 PM MRN: [id number] Endoscopist: Lynann Bologna , MD Age: 64 Referring MD:  Date of Birth: 06/19/1955 Gender: Female Account #: [id number] Procedure:                Colonoscopy Indications:              Colon cancer screening in patient at increased                            risk: Family history of colorectal cancer GM. Medicines:                Monitored Anesthesia Care Procedure:                Pre-Anesthesia Assessment:                           - Prior to the procedure, a History and Physical                            was performed, and patient medications and                            allergies were reviewed. The patient's tolerance of                            previous anesthesia was also reviewed. The risks                            and benefits of the procedure and the sedation                            options and risks were discussed with the patient.                            All questions were answered, and informed consent                            was obtained. Prior Anticoagulants: The patient has                            taken no previous anticoagulant or antiplatelet                            agents. ASA Grade Assessment: II - A patient with                            mild systemic disease. After reviewing the risks                            and benefits, the patient was deemed in                            satisfactory condition to undergo the procedure.  After obtaining informed consent, the colonoscope                            was passed under direct vision. Throughout the                            procedure, the patient's blood pressure, pulse, and                            oxygen saturations were monitored continuously. The                            Colonoscope was introduced through the anus and                            advanced to the 2  cm into the ileum. The                            colonoscopy was performed without difficulty. The                            patient tolerated the procedure well. The quality                            of the bowel preparation was good. The terminal                            ileum, ileocecal valve, appendiceal orifice, and                            rectum were photographed. Scope In: 2:44:05 PM Scope Out: 2:55:31 PM Scope Withdrawal Time: 0 hours 7 minutes 49 seconds  Total Procedure Duration: 0 hours 11 minutes 26 seconds  Findings:                 A few small-mouthed diverticula were found in the                            sigmoid colon.                           Non-bleeding internal hemorrhoids were found during                            retroflexion. The hemorrhoids were small.                           The terminal ileum appeared normal.                           The exam was otherwise without abnormality on                            direct and retroflexion views. Complications:            No immediate complications. Estimated  Blood Loss:     Estimated blood loss: none. Impression:               -Minimal sigmoid diverticulosis.                           -Otherwise normal colonoscopy to TI. Recommendation:           - Patient has a contact number available for                            emergencies. The signs and symptoms of potential                            delayed complications were discussed with the                            patient. Return to normal activities tomorrow.                            Written discharge instructions were provided to the                            patient.                           - Resume previous diet.                           - Continue present medications.                           - Repeat colonoscopy in 10 years for screening                            purposes. Earlier, if with any new problems or if                             there is any change in family history.                           - Return to GI clinic in 12 weeks. Check weight                            every week and record. Lynann Bologna, MD 08/15/2019 3:00:01 PM This report has been signed electronically.

## 2019-08-15 NOTE — Op Note (Addendum)
[facility] Patient Name: Cindy Ortiz Procedure Date: 08/15/2019 2:26 PM MRN: [id number] Endoscopist: Lynann Bologna , MD Age: 64 Referring MD:  Date of Birth: [birth year] Gender: Female Account #: [id number] Procedure:                Upper GI endoscopy Indications:              Dysphagia, wt loss Medicines:                Monitored Anesthesia Care Procedure:                Pre-Anesthesia Assessment:                           - Prior to the procedure, a History and Physical                            was performed, and patient medications and                            allergies were reviewed. The patient's tolerance of                            previous anesthesia was also reviewed. The risks                            and benefits of the procedure and the sedation                            options and risks were discussed with the patient.                            All questions were answered, and informed consent                            was obtained. Prior Anticoagulants: The patient has                            taken no previous anticoagulant or antiplatelet                            agents. ASA Grade Assessment: II - A patient with                            mild systemic disease. After reviewing the risks                            and benefits, the patient was deemed in                            satisfactory condition to undergo the procedure.                           After obtaining informed consent, the endoscope was  passed under direct vision. Throughout the                            procedure, the patient's blood pressure, pulse, and                            oxygen saturations were monitored continuously. The                            Endoscope was introduced through the mouth, and                            advanced to the second part of duodenum. The upper                            GI endoscopy was accomplished  without difficulty.                            The patient tolerated the procedure well. Scope In: Scope Out: Findings:                 The esophagus was normal with well-defined Z-line                            at 33 cm from incisors. Examined by NBI. It was                            decided, however, to proceed with dilation of the                            entire esophagus. The scope was withdrawn. Dilation                            was performed with a Maloney dilator with mild                            resistance at 50 Fr. Biopsies were obtained from                            the proximal and distal esophagus with cold forceps                            for histology to r/o eosinophilic esophagitis.                           A small hiatal hernia was present.                           Three 2 to 4 mm sessile polyps with no bleeding and                            no stigmata of recent bleeding were found in the  gastric body. The polyp was removed with a cold                            biopsy forceps. Resection and retrieval were                            complete.                           Localized mild inflammation characterized by                            erythema was found in the gastric antrum. Biopsies                            were taken with a cold forceps for histology.                           The examined duodenum was normal. Complications:            No immediate complications. Estimated Blood Loss:     Estimated blood loss: none. Impression:               - Small hiatal hernia.                           - Incidental small gastric polyps s/p polypectomy.                           - Gastritis. Biopsied. Recommendation:           - Patient has a contact number available for                            emergencies. The signs and symptoms of potential                            delayed complications were discussed with the                             patient. Return to normal activities tomorrow.                            Written discharge instructions were provided to the                            patient.                           - Post dilatation diet.                           - Continue present medications including Protonix                            40 mg p.o. QD.                           -  Await pathology results.                           - Return to GI clinic in 12 weeks. Lynann Bologna, MD 08/15/2019 3:05:48 PM This report has been signed electronically.

## 2019-08-15 NOTE — Patient Instructions (Signed)
Please read handouts provided. Follow Post Dilation Diet. Continue present medications including Protonix 40 mg everyday. Await pathology results. Return to GI clinic in 12 weeks. Check weight everyday and record.       YOU HAD AN ENDOSCOPIC PROCEDURE TODAY AT THE [facility]:   Refer to the procedure report that was given to you for any specific questions about what was found during the examination.  If the procedure report does not answer your questions, please call your gastroenterologist to clarify.  If you requested that your care partner not be given the details of your procedure findings, then the procedure report has been included in a sealed envelope for you to review at your convenience later.  YOU SHOULD EXPECT: Some feelings of bloating in the abdomen. Passage of more gas than usual.  Walking can help get rid of the air that was put into your GI tract during the procedure and reduce the bloating. If you had a lower endoscopy (such as a colonoscopy or flexible sigmoidoscopy) you may notice spotting of blood in your stool or on the toilet paper. If you underwent a bowel prep for your procedure, you may not have a normal bowel movement for a few days.  Please Note:  You might notice some irritation and congestion in your nose or some drainage.  This is from the oxygen used during your procedure.  There is no need for concern and it should clear up in a day or so.  SYMPTOMS TO REPORT IMMEDIATELY:   Following lower endoscopy (colonoscopy or flexible sigmoidoscopy):  Excessive amounts of blood in the stool  Significant tenderness or worsening of abdominal pains  Swelling of the abdomen that is new, acute  Fever of 100F or higher   Following upper endoscopy (EGD)  Vomiting of blood or coffee ground material  New chest pain or pain under the shoulder blades  Painful or persistently difficult swallowing  New shortness of breath  Fever of 100F or higher  Black,  tarry-looking stools  For urgent or emergent issues, a gastroenterologist can be reached at any hour by calling [phone]. Do not use MyChart messaging for urgent concerns.    DIET:   Drink plenty of fluids but you should avoid alcoholic beverages for 24 hours.  ACTIVITY:  You should plan to take it easy for the rest of today and you should NOT DRIVE or use heavy machinery until tomorrow (because of the sedation medicines used during the test).    FOLLOW UP: Our staff will call the number listed on your records 48-72 hours following your procedure to check on you and address any questions or concerns that you may have regarding the information given to you following your procedure. If we do not reach you, we will leave a message.  We will attempt to reach you two times.  During this call, we will ask if you have developed any symptoms of COVID 19. If you develop any symptoms (ie: fever, flu-like symptoms, shortness of breath, cough etc.) before then, please call [phone].  If you test positive for Covid 19 in the 2 weeks post procedure, please call and report this information to [place].    If any biopsies were taken you will be contacted by phone or by letter within the next 1-3 weeks.  Please call us at [phone] if you have not heard about the biopsies in 3 weeks.    SIGNATURES/CONFIDENTIALITY: You and/or your care partner have signed paperwork which will  be entered into your electronic medical record.  These signatures attest to the fact that that the information above on your After Visit Summary has been reviewed and is understood.  Full responsibility of the confidentiality of this discharge information lies with you and/or your care-partner. 

## 2019-08-15 NOTE — Progress Notes (Signed)
Report to PACU, RN, vss, BBS= Clear.  

## 2019-08-17 ENCOUNTER — Telehealth: Payer: Self-pay

## 2019-08-17 NOTE — Telephone Encounter (Signed)
Second attempt follow up call to pt, lm on vm. 

## 2019-08-17 NOTE — Telephone Encounter (Signed)
NO ANSWER, MESSAGE LEFT FOR PATIENT. 

## 2019-09-02 ENCOUNTER — Encounter: Payer: Self-pay | Admitting: Gastroenterology

## 2019-09-04 DIAGNOSIS — Z23 Encounter for immunization: Secondary | ICD-10-CM | POA: Diagnosis not present

## 2019-12-06 DIAGNOSIS — L821 Other seborrheic keratosis: Secondary | ICD-10-CM | POA: Diagnosis not present

## 2019-12-06 DIAGNOSIS — L578 Other skin changes due to chronic exposure to nonionizing radiation: Secondary | ICD-10-CM | POA: Diagnosis not present

## 2019-12-12 ENCOUNTER — Encounter: Payer: Self-pay | Admitting: Gastroenterology

## 2019-12-26 DIAGNOSIS — Z6823 Body mass index (BMI) 23.0-23.9, adult: Secondary | ICD-10-CM | POA: Diagnosis not present

## 2019-12-26 DIAGNOSIS — Z01419 Encounter for gynecological examination (general) (routine) without abnormal findings: Secondary | ICD-10-CM | POA: Diagnosis not present

## 2019-12-26 DIAGNOSIS — Z1231 Encounter for screening mammogram for malignant neoplasm of breast: Secondary | ICD-10-CM | POA: Diagnosis not present

## 2019-12-26 DIAGNOSIS — Z13820 Encounter for screening for osteoporosis: Secondary | ICD-10-CM | POA: Diagnosis not present

## 2020-01-03 DIAGNOSIS — L820 Inflamed seborrheic keratosis: Secondary | ICD-10-CM | POA: Diagnosis not present

## 2020-01-17 DIAGNOSIS — E039 Hypothyroidism, unspecified: Secondary | ICD-10-CM | POA: Diagnosis not present

## 2020-01-17 DIAGNOSIS — Z20828 Contact with and (suspected) exposure to other viral communicable diseases: Secondary | ICD-10-CM | POA: Diagnosis not present

## 2020-01-17 DIAGNOSIS — R109 Unspecified abdominal pain: Secondary | ICD-10-CM | POA: Diagnosis not present

## 2020-01-17 DIAGNOSIS — M791 Myalgia, unspecified site: Secondary | ICD-10-CM | POA: Diagnosis not present

## 2020-01-17 DIAGNOSIS — R0989 Other specified symptoms and signs involving the circulatory and respiratory systems: Secondary | ICD-10-CM | POA: Diagnosis not present

## 2020-02-23 DIAGNOSIS — M546 Pain in thoracic spine: Secondary | ICD-10-CM | POA: Diagnosis not present

## 2020-02-23 DIAGNOSIS — M5410 Radiculopathy, site unspecified: Secondary | ICD-10-CM | POA: Diagnosis not present

## 2020-02-23 DIAGNOSIS — J189 Pneumonia, unspecified organism: Secondary | ICD-10-CM | POA: Diagnosis not present

## 2020-02-23 DIAGNOSIS — M542 Cervicalgia: Secondary | ICD-10-CM | POA: Diagnosis not present

## 2020-04-09 ENCOUNTER — Other Ambulatory Visit: Payer: Self-pay

## 2020-04-09 MED ORDER — PANTOPRAZOLE SODIUM 40 MG PO TBEC: 40.0000 mg | DELAYED_RELEASE_TABLET | Freq: Every day | ORAL | 1 refills | Status: DC

## 2020-04-23 DIAGNOSIS — Z23 Encounter for immunization: Secondary | ICD-10-CM | POA: Diagnosis not present

## 2020-05-08 DIAGNOSIS — Z23 Encounter for immunization: Secondary | ICD-10-CM | POA: Diagnosis not present

## 2020-06-19 ENCOUNTER — Other Ambulatory Visit: Payer: Self-pay

## 2020-06-19 ENCOUNTER — Ambulatory Visit: Payer: BC Managed Care – PPO | Admitting: Gastroenterology

## 2020-06-19 ENCOUNTER — Encounter: Payer: Self-pay | Admitting: Gastroenterology

## 2020-06-19 ENCOUNTER — Telehealth: Payer: Self-pay | Admitting: Gastroenterology

## 2020-06-19 VITALS — BP 122/84 | HR 84 | Ht 64.0 in | Wt 140.5 lb

## 2020-06-19 DIAGNOSIS — R1011 Right upper quadrant pain: Secondary | ICD-10-CM

## 2020-06-19 DIAGNOSIS — R490 Dysphonia: Secondary | ICD-10-CM

## 2020-06-19 DIAGNOSIS — K219 Gastro-esophageal reflux disease without esophagitis: Secondary | ICD-10-CM | POA: Diagnosis not present

## 2020-06-19 DIAGNOSIS — R1310 Dysphagia, unspecified: Secondary | ICD-10-CM | POA: Diagnosis not present

## 2020-06-19 DIAGNOSIS — Z800 Family history of malignant neoplasm of digestive organs: Secondary | ICD-10-CM | POA: Diagnosis not present

## 2020-06-19 DIAGNOSIS — R1012 Left upper quadrant pain: Secondary | ICD-10-CM

## 2020-06-19 MED ORDER — DEXLANSOPRAZOLE 60 MG PO CPDR: 60.0000 mg | DELAYED_RELEASE_CAPSULE | Freq: Every day | ORAL | 11 refills | Status: DC

## 2020-06-19 MED ORDER — DEXLANSOPRAZOLE 60 MG PO CPDR: 60.0000 mg | DELAYED_RELEASE_CAPSULE | Freq: Every day | ORAL | 4 refills | Status: DC

## 2020-06-19 NOTE — Telephone Encounter (Signed)
Inbound call from patient stating Dexilant is not covered under insurance and would like alternative sent to pharmacy please.

## 2020-06-19 NOTE — Progress Notes (Signed)
Chief Complaint: FU  Referring Provider:  Buckner Malta, MD      ASSESSMENT AND PLAN;   #1. GERD with eso dysphagia/hoarseness. Neg Ba swallow. Neg CT neck. Neg ENT eval Dr Christell Constant #2. RUQ/LUQ pain (resolved)  Plan: -Changed protonix  to dexilant 60mg  po qd  #30. 11 refills. -Add pepcid 20mg  po qd if still with problems. -Raise HOB 6' blocks -I have instructed patient to chew foods especially meats and breads well and eat slowly. -If still continues to have problems, will consider eso manometry/24hr pH.  She would like to hold off at the present time. -Call in 3-4 weeks. -FU PRN  HPI:    Cindy Ortiz is a 65 y.o. female   Has been using protonix 40 BID, still having symptoms of regurgitation, occasional heartburn.  Her dysphagia got significantly better after dilation.  However, over the last 3 to 4 months she is having intermittent problems certainly not as bad.  Would like to hold off on repeat EGD with dilatation at this point.  She will let Consuello Masse know if it gets worse.  No fever or chills.  No further weight loss.  The abdominal pain has resolved.    From previous notes- seen by Dr. 77 ENT-negative evaluation including nasopharyngoscopy except for an edema of the arytenoids, likely consistent with reflux.  Advised to increase Protonix to twice daily.  She had CT neck which was unremarkable.  Continues to have hoarseness occ.   No weight loss Wt Readings from Last 3 Encounters:  06/19/20 140 lb 8 oz (63.7 kg)  08/15/19 137 lb (62.1 kg)  07/28/19 137 lb 6 oz (62.3 kg)     Past GI procedures: EGD 02/2017 small HH.  Incidental gastric polyps s/p polypectomy. Neg eso bx for EoE, hyperplastic. Pos HP Colon 08/2019 ([facility])  -Minimal sigmoid diverticulosis. -Otherwise normal colonoscopy to TI. -Rpt in 10 yrs. Barium swallow 02/2019 -Spontaneous reflux -No stricture -Barium tablet passed without any problems.  SH-works as a 09/2019.  2 boys. Past Medical History:  Diagnosis Date  . Anxiety   . Family history of colon cancer   . Gallstones   . GERD (gastroesophageal reflux disease)   . History of colon polyps   . Hypothyroidism   . IBS (irritable bowel syndrome)    w/ Constipation  . Lactose intolerance   . UTI (urinary tract infection)     Past Surgical History:  Procedure Laterality Date  . CHOLECYSTECTOMY    . COLONOSCOPY  06/25/2014   Mild diverticulosis. Otherwise normal colonoscopy to TI.   Actor ESOPHAGOGASTRODUODENOSCOPY  03/03/2017   Small transient hiatal hernia. Gastritis. Incidental polyps status post polypectomy.   . INGUINAL HERNIA REPAIR    . PARTIAL HYSTERECTOMY    . RIGHT OOPHORECTOMY  05/1997    Family History  Problem Relation Age of Onset  . Ovarian cancer Maternal Grandmother   . Diverticulitis Brother   . Colon cancer Neg Hx   . Esophageal cancer Neg Hx     Social History   Tobacco Use  . Smoking status: Never Smoker  . Smokeless tobacco: Never Used  Vaping Use  . Vaping Use: Never used  Substance Use Topics  . Alcohol use: Not Currently  . Drug use: Not Currently    Current Outpatient Medications  Medication Sig Dispense Refill  . ALPRAZolam (XANAX) 0.5 MG tablet Take 0.5 mg by mouth as needed.    03/05/2017 estradiol (ESTRACE) 1 MG tablet Take 1  mg by mouth daily.    Marland Kitchen levothyroxine (SYNTHROID) 75 MCG tablet 1 tablet daily.    . pantoprazole (PROTONIX) 40 MG tablet Take 1 tablet (40 mg total) by mouth daily. 90 tablet 1   No current facility-administered medications for this visit.    Allergies  Allergen Reactions  . Codeine     REACTION: Temporary blindness    Review of Systems:  neg Psychiatric/Behavioral: No anxiety or depression     Physical Exam:    BP 122/84 (BP Location: Left Arm, Patient Position: Sitting, Cuff Size: Normal)   Pulse 84   Ht 5\' 4"  (1.626 m)   Wt 140 lb 8 oz (63.7 kg)   BMI 24.12 kg/m  Filed Weights   06/19/20 0903  Weight:  140 lb 8 oz (63.7 kg)   Constitutional:  Well-developed, in no acute distress. Psychiatric: Normal mood and affect. Behavior is normal. HEENT: Pupils normal.  Conjunctivae are normal. No scleral icterus. Neck supple.  Cardiovascular: Normal rate, regular rhythm. No edema Pulmonary/chest: Effort normal and breath sounds normal. No wheezing, rales or rhonchi. Abdominal: Soft, nondistended. Nontender. Bowel sounds active throughout. There are no masses palpable. No hepatomegaly. Rectal:  defered Neurological: Alert and oriented to person place and time. Skin: Skin is warm and dry. No rashes noted.  Data Reviewed: I have personally reviewed following labs and imaging studies 25 minutes spent with the patient today. Greater than 50% was spent in counseling and coordination of care with the patient    06/21/20, MD 06/19/2020, 9:20 AM  Cc: 06/21/2020, MD

## 2020-06-19 NOTE — Patient Instructions (Signed)
If you are age 65 or older, your body mass index should be between 23-30. Your Body mass index is 24.12 kg/m. If this is out of the aforementioned range listed, please consider follow up with your Primary Care Provider.  If you are age 36 or younger, your body mass index should be between 19-25. Your Body mass index is 24.12 kg/m. If this is out of the aformentioned range listed, please consider follow up with your Primary Care Provider.   We have sent the following medications to your pharmacy for you to pick up at your convenience: Dexilant  Call in 3 weeks to let us know how you are doing.  Thank you,  Dr. Lynann Bologna

## 2020-06-19 NOTE — Telephone Encounter (Signed)
Pharmacy had wrong insurance. Its 90 dollars for a 90 day supply per [place] from [facility]

## 2020-07-18 ENCOUNTER — Telehealth: Payer: Self-pay | Admitting: Gastroenterology

## 2020-07-18 NOTE — Telephone Encounter (Signed)
Called patient and she said that the pharmacy is giving her a 30 day supply of Dexilant although we have sent in a 90 day script for the patient. She has to pay 45 dollars every month and she said that is something she cant afford. Even if she was to do the 90 day supply for 90 dollars she said its something that she cant really do.  She is requesting to go back on Protonix because she said she doesn't feel like Dexilant is helping her any. Her acid reflux is good and bad depending on the day and that if she has a choice she would just switch to the cheaper medication (Protonix). I told her that I will see what Dr Chales Abrahams recommends.

## 2020-07-18 NOTE — Telephone Encounter (Signed)
Patient called requesting to changed her medication back to Pantoprazole instead of Dexilant for her insurance does not cover it. Please advise the patient

## 2020-07-19 MED ORDER — PANTOPRAZOLE SODIUM 40 MG PO TBEC: 40.0000 mg | DELAYED_RELEASE_TABLET | Freq: Every day | ORAL | 11 refills | Status: DC

## 2020-07-19 NOTE — Telephone Encounter (Signed)
Medication Protonix sent in to [facility] and patient made aware

## 2020-10-07 DIAGNOSIS — M546 Pain in thoracic spine: Secondary | ICD-10-CM | POA: Diagnosis not present

## 2020-10-07 DIAGNOSIS — Z9181 History of falling: Secondary | ICD-10-CM | POA: Diagnosis not present

## 2020-10-07 DIAGNOSIS — E039 Hypothyroidism, unspecified: Secondary | ICD-10-CM | POA: Diagnosis not present

## 2020-10-07 DIAGNOSIS — Z6824 Body mass index (BMI) 24.0-24.9, adult: Secondary | ICD-10-CM | POA: Diagnosis not present

## 2020-10-07 DIAGNOSIS — M542 Cervicalgia: Secondary | ICD-10-CM | POA: Diagnosis not present

## 2020-10-10 DIAGNOSIS — K219 Gastro-esophageal reflux disease without esophagitis: Secondary | ICD-10-CM | POA: Diagnosis not present

## 2020-10-10 DIAGNOSIS — E785 Hyperlipidemia, unspecified: Secondary | ICD-10-CM | POA: Diagnosis not present

## 2020-10-10 DIAGNOSIS — Z131 Encounter for screening for diabetes mellitus: Secondary | ICD-10-CM | POA: Diagnosis not present

## 2020-10-10 DIAGNOSIS — E039 Hypothyroidism, unspecified: Secondary | ICD-10-CM | POA: Diagnosis not present

## 2020-10-29 DIAGNOSIS — Z0000 Encounter for general adult medical examination without abnormal findings: Secondary | ICD-10-CM | POA: Diagnosis not present

## 2020-10-30 DIAGNOSIS — M542 Cervicalgia: Secondary | ICD-10-CM | POA: Diagnosis not present

## 2020-11-12 ENCOUNTER — Encounter: Payer: Self-pay | Admitting: *Deleted

## 2020-11-12 ENCOUNTER — Encounter: Payer: Self-pay | Admitting: Cardiology

## 2020-11-20 DIAGNOSIS — K8020 Calculus of gallbladder without cholecystitis without obstruction: Secondary | ICD-10-CM | POA: Insufficient documentation

## 2020-11-20 DIAGNOSIS — N390 Urinary tract infection, site not specified: Secondary | ICD-10-CM | POA: Insufficient documentation

## 2020-11-20 DIAGNOSIS — K589 Irritable bowel syndrome without diarrhea: Secondary | ICD-10-CM | POA: Insufficient documentation

## 2020-11-20 DIAGNOSIS — Z86010 Personal history of colonic polyps: Secondary | ICD-10-CM | POA: Insufficient documentation

## 2020-11-20 DIAGNOSIS — E739 Lactose intolerance, unspecified: Secondary | ICD-10-CM | POA: Insufficient documentation

## 2020-11-20 DIAGNOSIS — Z800 Family history of malignant neoplasm of digestive organs: Secondary | ICD-10-CM | POA: Insufficient documentation

## 2020-11-21 DIAGNOSIS — M50323 Other cervical disc degeneration at C6-C7 level: Secondary | ICD-10-CM | POA: Diagnosis not present

## 2020-11-21 DIAGNOSIS — M546 Pain in thoracic spine: Secondary | ICD-10-CM | POA: Diagnosis not present

## 2020-11-21 DIAGNOSIS — M9902 Segmental and somatic dysfunction of thoracic region: Secondary | ICD-10-CM | POA: Diagnosis not present

## 2020-11-21 DIAGNOSIS — M5413 Radiculopathy, cervicothoracic region: Secondary | ICD-10-CM | POA: Diagnosis not present

## 2020-11-21 DIAGNOSIS — M9901 Segmental and somatic dysfunction of cervical region: Secondary | ICD-10-CM | POA: Diagnosis not present

## 2020-11-25 DIAGNOSIS — M546 Pain in thoracic spine: Secondary | ICD-10-CM | POA: Diagnosis not present

## 2020-11-25 DIAGNOSIS — M9901 Segmental and somatic dysfunction of cervical region: Secondary | ICD-10-CM | POA: Diagnosis not present

## 2020-11-25 DIAGNOSIS — M50323 Other cervical disc degeneration at C6-C7 level: Secondary | ICD-10-CM | POA: Diagnosis not present

## 2020-11-25 DIAGNOSIS — M5413 Radiculopathy, cervicothoracic region: Secondary | ICD-10-CM | POA: Diagnosis not present

## 2020-11-25 DIAGNOSIS — M9902 Segmental and somatic dysfunction of thoracic region: Secondary | ICD-10-CM | POA: Diagnosis not present

## 2020-11-28 DIAGNOSIS — M5413 Radiculopathy, cervicothoracic region: Secondary | ICD-10-CM | POA: Diagnosis not present

## 2020-11-28 DIAGNOSIS — M546 Pain in thoracic spine: Secondary | ICD-10-CM | POA: Diagnosis not present

## 2020-11-28 DIAGNOSIS — M9901 Segmental and somatic dysfunction of cervical region: Secondary | ICD-10-CM | POA: Diagnosis not present

## 2020-11-28 DIAGNOSIS — M50323 Other cervical disc degeneration at C6-C7 level: Secondary | ICD-10-CM | POA: Diagnosis not present

## 2020-11-28 DIAGNOSIS — M9902 Segmental and somatic dysfunction of thoracic region: Secondary | ICD-10-CM | POA: Diagnosis not present

## 2020-12-02 DIAGNOSIS — M5413 Radiculopathy, cervicothoracic region: Secondary | ICD-10-CM | POA: Diagnosis not present

## 2020-12-02 DIAGNOSIS — M9902 Segmental and somatic dysfunction of thoracic region: Secondary | ICD-10-CM | POA: Diagnosis not present

## 2020-12-02 DIAGNOSIS — M9901 Segmental and somatic dysfunction of cervical region: Secondary | ICD-10-CM | POA: Diagnosis not present

## 2020-12-02 DIAGNOSIS — M546 Pain in thoracic spine: Secondary | ICD-10-CM | POA: Diagnosis not present

## 2020-12-02 DIAGNOSIS — M50323 Other cervical disc degeneration at C6-C7 level: Secondary | ICD-10-CM | POA: Diagnosis not present

## 2020-12-04 NOTE — Progress Notes (Signed)
Cardiology Office Note:    Date:  12/05/2020   ID:  AVNEET ASHMORE, DOB 1956/02/25, MRN [id number]  PCP:  Buckner Malta, MD  Cardiologist:  Norman Herrlich, MD   Referring MD: Buckner Malta, MD  ASSESSMENT:    1. Bradycardia   2. Acquired hypothyroidism    PLAN:    In order of problems listed above:  She appears to be asymptomatic Baseline EKG is normal I told her this is a real dilemma and you have 1 recording and I asked her to wear a 3-day event monitor I think it will give [place] reassurance she agrees and I will see her back in the office as needed.  She is taking no rate slowing medications.  I do think she requires further cardiac evaluation like echocardiogram or an ischemia evaluation Stable continue current thyroid supplement  Next appointment as needed   Medication Adjustments/Labs and Tests Ordered: Current medicines are reviewed at length with the patient today.  Concerns regarding medicines are outlined above.  No orders of the defined types were placed in this encounter.  No orders of the defined types were placed in this encounter.    Chief Complaint  Patient presents with   Bradycardia     History of Present Illness:    Cindy Ortiz is a 65 y.o. female with a history of hypothyroidism who is being seen today for the evaluation of bradycardia at the request of Buckner Malta, MD.  Chart review shows no cardiology records or testing available in epic. Office records show most recent labs 10/10/2020: Cholesterol 196 LDL 115 HDL 52 CMP normal creatinine 1.0 GFR greater than 60 cc sodium 140 potassium 4.0 hemoglobin 13.4 TSH T3 and T4 were normal EKG 10/29/2020 independently reviewed sinus rhythm rate 50 to 55 bpm otherwise normal She had a stress echocardiogram performed at [facility] in 2013 showing normal exercise tolerance and within normal test.  Left ventricular function normal.  She has no known history of heart disease congenital  rheumatic or heart murmur. She has had no symptoms of lightheadedness or syncope and does not know of any previous episodes of slow heart rate. With activity she has mild shortness of breath not severe or limiting at times she will get left upper abdomen left lower chest discomfort nonanginal in nature and not exertional and not severe sustained or frequent. She attributes it to GI issues. Past Medical History:  Diagnosis Date   Anxiety    Family history of colon cancer    Gallstones    GERD (gastroesophageal reflux disease)    History of colon polyps    Hypothyroidism    IBS (irritable bowel syndrome)    w/ Constipation   Lactose intolerance    UTI (urinary tract infection)     Past Surgical History:  Procedure Laterality Date   CHOLECYSTECTOMY     COLONOSCOPY  06/25/2014   Mild diverticulosis. Otherwise normal colonoscopy to TI.    ESOPHAGOGASTRODUODENOSCOPY  03/03/2017   Small transient hiatal hernia. Gastritis. Incidental polyps status post polypectomy.    INGUINAL HERNIA REPAIR     PARTIAL HYSTERECTOMY  1985   RIGHT OOPHORECTOMY  05/1997    Current Medications: Current Meds  Medication Sig   estradiol (ESTRACE) 1 MG tablet Take 1 mg by mouth daily.   levothyroxine (SYNTHROID) 75 MCG tablet Take 75 mcg by mouth daily.   pantoprazole (PROTONIX) 40 MG tablet Take 40 mg by mouth daily.     Allergies:   Codeine  Social History   Socioeconomic History   Marital status: Married    Spouse name: Not on file   Number of children: Not on file   Years of education: Not on file   Highest education level: Not on file  Occupational History   Not on file  Tobacco Use   Smoking status: Never   Smokeless tobacco: Never  Vaping Use   Vaping Use: Never used  Substance and Sexual Activity   Alcohol use: Not Currently   Drug use: Not Currently   Sexual activity: Not on file  Other Topics Concern   Not on file  Social History Narrative   Not on file   Social  Determinants of Health   Financial Resource Strain: Not on file  Food Insecurity: Not on file  Transportation Needs: Not on file  Physical Activity: Not on file  Stress: Not on file  Social Connections: Not on file     Family History: The patient's family history includes Diverticulitis in her brother; Ovarian cancer in her maternal grandmother. There is no history of Colon cancer or Esophageal cancer.  ROS:   ROS Please see the history of present illness.     All other systems reviewed and are negative.  EKGs/Labs/Other Studies Reviewed:    The following studies were reviewed today:   EKG:  EKG is  ordered today.  The ekg ordered today is personally reviewed and demonstrates sinus rhythm 61 bpm normal EKG   Physical Exam:    VS:  BP 124/76   Pulse 61   Ht 5' 4.6" (1.641 m)   Wt 141 lb (64 kg)   SpO2 95%   BMI 23.76 kg/m     Wt Readings from Last 3 Encounters:  12/05/20 141 lb (64 kg)  10/29/20 139 lb (63 kg)  06/19/20 140 lb 8 oz (63.7 kg)     GEN:  Well nourished, well developed in no acute distress HEENT: Normal NECK: No JVD; No carotid bruits LYMPHATICS: No lymphadenopathy CARDIAC: RRR, no murmurs, rubs, gallops RESPIRATORY:  Clear to auscultation without rales, wheezing or rhonchi  ABDOMEN: Soft, non-tender, non-distended MUSCULOSKELETAL:  No edema; No deformity  SKIN: Warm and dry NEUROLOGIC:  Alert and oriented x 3 PSYCHIATRIC:  Normal affect     Signed, Norman Herrlich, MD  12/05/2020 9:26 AM    [facility]

## 2020-12-05 ENCOUNTER — Other Ambulatory Visit: Payer: Self-pay

## 2020-12-05 ENCOUNTER — Encounter: Payer: Self-pay | Admitting: Cardiology

## 2020-12-05 ENCOUNTER — Ambulatory Visit: Payer: PPO | Admitting: Cardiology

## 2020-12-05 ENCOUNTER — Ambulatory Visit (INDEPENDENT_AMBULATORY_CARE_PROVIDER_SITE_OTHER): Payer: PPO

## 2020-12-05 VITALS — BP 124/76 | HR 61 | Ht 64.6 in | Wt 141.0 lb

## 2020-12-05 DIAGNOSIS — E039 Hypothyroidism, unspecified: Secondary | ICD-10-CM

## 2020-12-05 DIAGNOSIS — M9902 Segmental and somatic dysfunction of thoracic region: Secondary | ICD-10-CM | POA: Diagnosis not present

## 2020-12-05 DIAGNOSIS — M9901 Segmental and somatic dysfunction of cervical region: Secondary | ICD-10-CM | POA: Diagnosis not present

## 2020-12-05 DIAGNOSIS — M50323 Other cervical disc degeneration at C6-C7 level: Secondary | ICD-10-CM | POA: Diagnosis not present

## 2020-12-05 DIAGNOSIS — M546 Pain in thoracic spine: Secondary | ICD-10-CM | POA: Diagnosis not present

## 2020-12-05 DIAGNOSIS — R001 Bradycardia, unspecified: Secondary | ICD-10-CM | POA: Diagnosis not present

## 2020-12-05 DIAGNOSIS — M5413 Radiculopathy, cervicothoracic region: Secondary | ICD-10-CM | POA: Diagnosis not present

## 2020-12-05 NOTE — Patient Instructions (Signed)
Medication Instruction Your physician recommends that you continue on your current medications as directed. Please refer to the Current Medication list given to you today.   *If you need a refill on your cardiac medications before your next appointment, please call your pharmacy*   Lab Work: NONE If you have labs (blood work) drawn today and your tests are completely normal, you will receive your results only by: Cindy Ortiz (if you have MyChart) OR A paper copy in the mail If you have any lab test that is abnormal or we need to change your treatment, we will call you to review the results.   Testing/Procedures: ZIO AT Long term monitor-Live Telemetry  Your physician has requested you wear a ZIO patch monitor for 14 days.  This is a single patch monitor. [employer] supplies one patch monitor per enrollment. Additional  stickers are not available.  Please do not apply patch if you will be having a Nuclear Stress Test, Echocardiogram, Cardiac CT, MRI,  or Chest Xray during the period you would be wearing the monitor. The patch cannot be worn during  these tests. You cannot remove and re-apply the ZIO AT patch monitor.  Your ZIO patch monitor will be mailed 3 day USPS to your address on file. It may take 3-5 days to  receive your monitor after you have been enrolled.  Once you have received your monitor, please review the enclosed instructions. Your monitor has  already been registered assigning a specific monitor serial # to you.   Billing and Patient Assistance Program information  Cindy Ortiz has been supplied with any insurance information on record for billing. [employer] offers a sliding scale Patient Assistance Program for patients without insurance, or whose  insurance does not completely cover the cost of the ZIO patch monitor. You must apply for the  Patient Assistance Program to qualify for the discounted rate. To apply, call [employer] at [phone],  select option 4, select  option 2 , ask to apply for the Patient Assistance Program, (you can request an  interpreter if needed). [employer] will ask your household income and how many people are in your  household. [employer] will quote your out-of-pocket cost based on this information. They will also be able  to set up a 12 month interest free payment plan if needed.  Applying the monitor   Shave hair from upper left chest.  Hold the abrader disc by orange tab. Rub the abrader in 40 strokes over left upper chest as indicated in  your monitor instructions.  Clean area with 4 enclosed alcohol pads. Use all pads to ensure the area is cleaned thoroughly. Let  dry.  Apply patch as indicated in monitor instructions. Patch will be placed under collarbone on left side of  chest with arrow pointing upward.  Rub patch adhesive wings for 2 minutes. Remove the white label marked "1". Remove the white label  marked "2". Rub patch adhesive wings for 2 additional minutes.  While looking in a mirror, press and release button in center of patch. A small green light will flash 3-4  times. This will be your only indicator that the monitor has been turned on.  Do not shower for the first 24 hours. You may shower after the first 24 hours.  Press the button if you feel a symptom. You will hear a small click. Record Date, Time and Symptom in  the Patient Log.   Starting the Gateway  In your kit there is a Optometrist  the size of a cellphone. This is Airline pilot. It transmits all your  recorded data to [name]. This box must always stay within 10 feet of you. Open the box and push the *  button. There will be a light that blinks orange and then green a few times. When the light stops  blinking, the Gateway is connected to the ZIO patch. Call [employer] at [phone] to confirm your monitor is transmitting.  Returning your monitor  Remove your patch and place it inside the [name]. In the lower half of the Gateway there is a white   bag with prepaid postage on it. Place Gateway in bag and seal. Mail package back to [name] as soon as  possible. Your physician should have your final report approximately 7 days after you have mailed back  your monitor. Call [name] at [phone] if you have questions regarding your ZIO AT  patch monitor. Call them immediately if you see an orange light blinking on your monitor.  If your monitor falls off in less than 4 days, contact our Monitor department at [phone]. If your  monitor becomes loose or falls off after 4 days call [employer] at [phone] for suggestions on  securing your monitor    Follow-Up: At [name], you and your health needs are our priority.  As part of our continuing mission to provide you with exceptional heart care, we have created designated Provider Care Teams.  These Care Teams include your primary Cardiologist (physician) and Advanced Practice Providers (APPs -  Physician Assistants and Nurse Practitioners) who all work together to provide you with the care you need, when you need it.  We recommend signing up for the patient portal called "MyChart".  Sign up information is provided on this After Visit Summary.  MyChart is used to connect with patients for Virtual Visits (Telemedicine).  Patients are able to view lab/test results, encounter notes, upcoming appointments, etc.  Non-urgent messages can be sent to your provider as well.   To learn more about what you can do with MyChart, go to [email or web address].  Follow up as needed Dr. Shirlee More     Other Instructions

## 2020-12-08 DIAGNOSIS — R001 Bradycardia, unspecified: Secondary | ICD-10-CM | POA: Diagnosis not present

## 2020-12-12 DIAGNOSIS — R001 Bradycardia, unspecified: Secondary | ICD-10-CM | POA: Diagnosis not present

## 2020-12-30 DIAGNOSIS — Z124 Encounter for screening for malignant neoplasm of cervix: Secondary | ICD-10-CM | POA: Diagnosis not present

## 2020-12-30 DIAGNOSIS — Z6824 Body mass index (BMI) 24.0-24.9, adult: Secondary | ICD-10-CM | POA: Diagnosis not present

## 2020-12-30 DIAGNOSIS — F419 Anxiety disorder, unspecified: Secondary | ICD-10-CM | POA: Diagnosis not present

## 2020-12-30 DIAGNOSIS — R102 Pelvic and perineal pain: Secondary | ICD-10-CM | POA: Diagnosis not present

## 2020-12-30 DIAGNOSIS — Z1231 Encounter for screening mammogram for malignant neoplasm of breast: Secondary | ICD-10-CM | POA: Diagnosis not present

## 2020-12-30 DIAGNOSIS — Z01419 Encounter for gynecological examination (general) (routine) without abnormal findings: Secondary | ICD-10-CM | POA: Diagnosis not present

## 2020-12-30 DIAGNOSIS — M545 Low back pain, unspecified: Secondary | ICD-10-CM | POA: Diagnosis not present

## 2021-02-19 DIAGNOSIS — Z23 Encounter for immunization: Secondary | ICD-10-CM | POA: Diagnosis not present

## 2021-03-29 IMAGING — RF DG ESOPHAGUS
7 series · 19 of 24 positions shown · non-contrast
Comparison: None.

CLINICAL DATA: Gastroesophageal reflux disease. Constantly clears
throat. Hoarseness.

EXAM:
ESOPHOGRAM / BARIUM SWALLOW / BARIUM TABLET STUDY
TECHNIQUE: Combined double contrast and single contrast examination performed
using effervescent crystals, thick barium liquid, and thin barium
liquid. The patient was observed with fluoroscopy swallowing a 13 mm
barium sulphate tablet.
FLUOROSCOPY TIME:  Fluoroscopy Time:  2 minutes 18 seconds
Radiation Exposure Index (if provided by the fluoroscopic device):
29 mGy
Number of Acquired Spot Images: 0

[Series 1: cp_standard · 0.34mm/px · 2 of 501 frames shown (1 of 7)]
[frame 76/501]
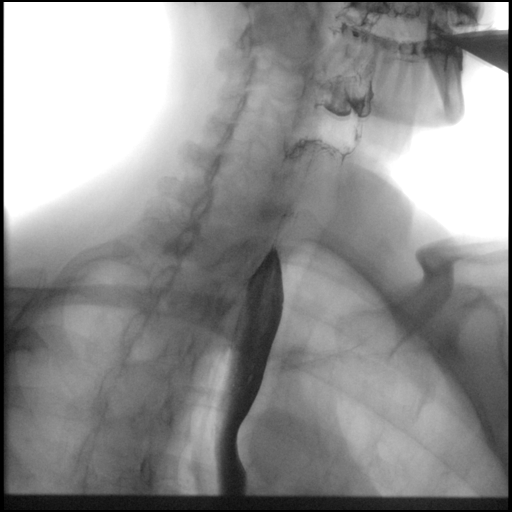
[frame 137/501]
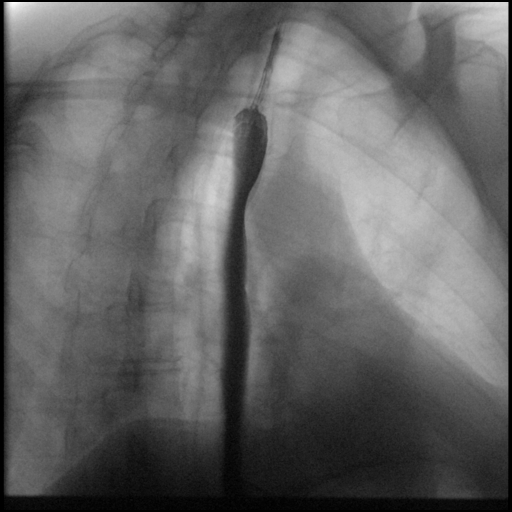

[Series 2: cp_standard · 0.36mm/px · 4 of 81 frames shown (2 of 7)]
[frame 13/81]
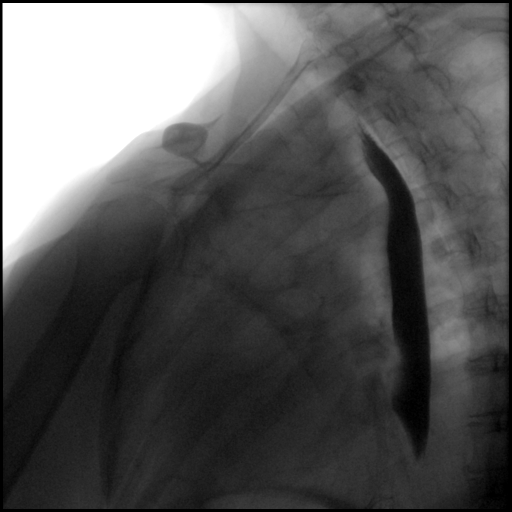
[frame 41/81]
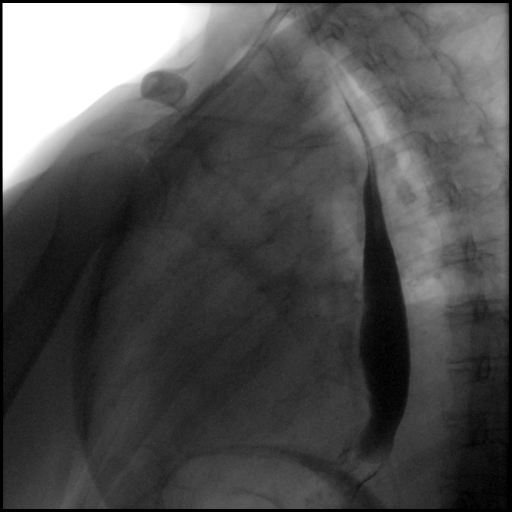
[frame 69/81]
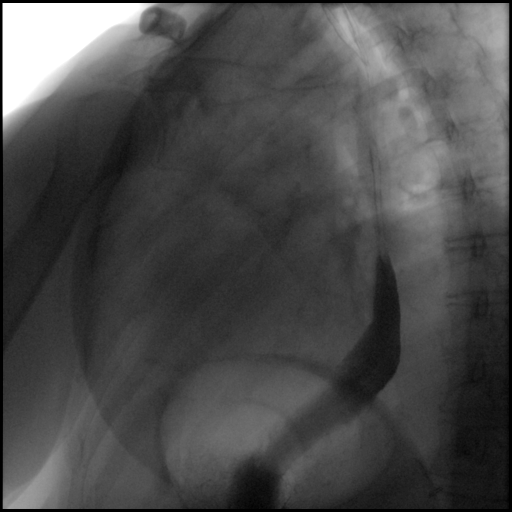
[frame 80/81]
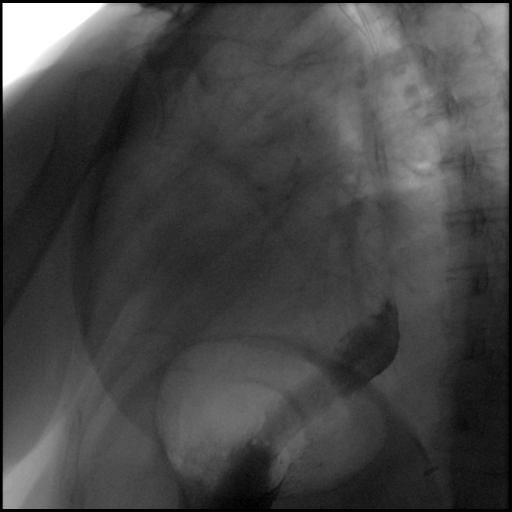

[Series 3: cp_standard · 0.36mm/px · 2 of 148 frames shown (3 of 7)]
[frame 75/148]
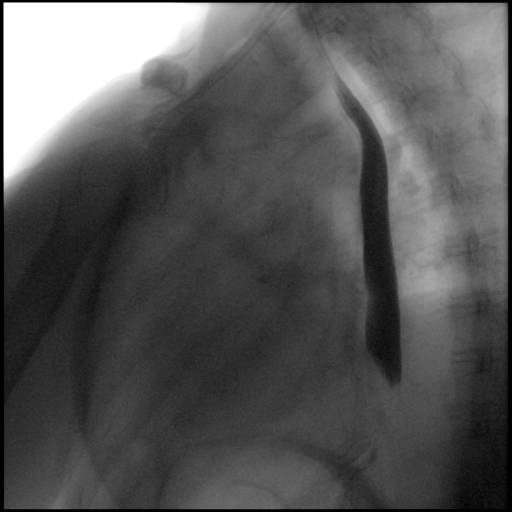
[frame 147/148]
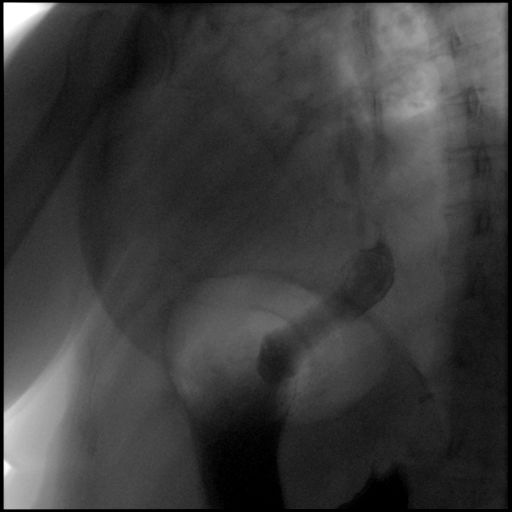

[Series 4: cp_standard · 0.36mm/px · 3 of 167 frames shown (4 of 7)]
[frame 26/167]
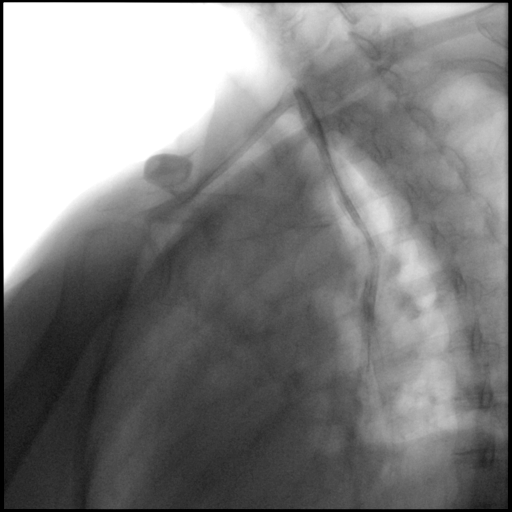
[frame 142/167]
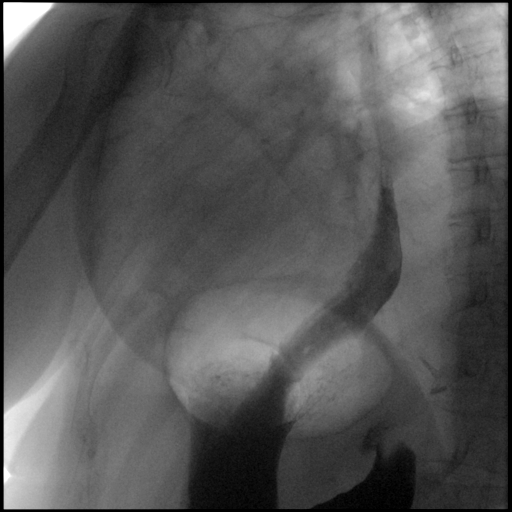
[frame 160/167]
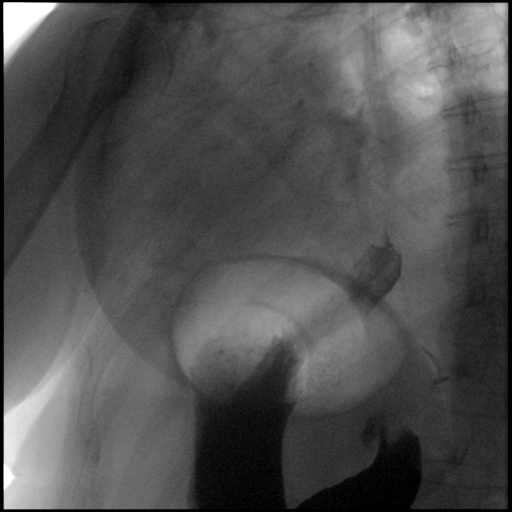

[Series 5: cp_standard · 0.36mm/px · 2 of 295 frames shown (5 of 7)]
[frame 1/295]
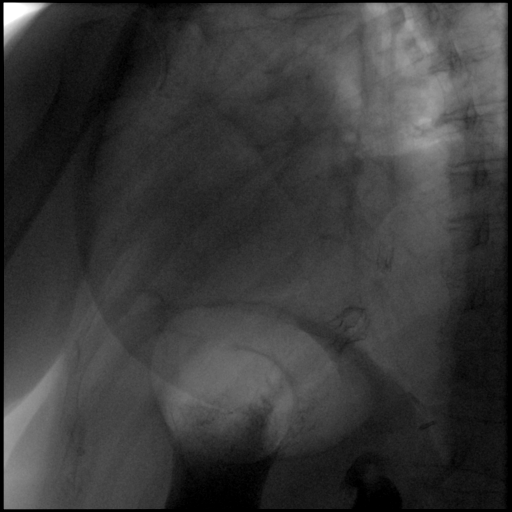
[frame 148/295]
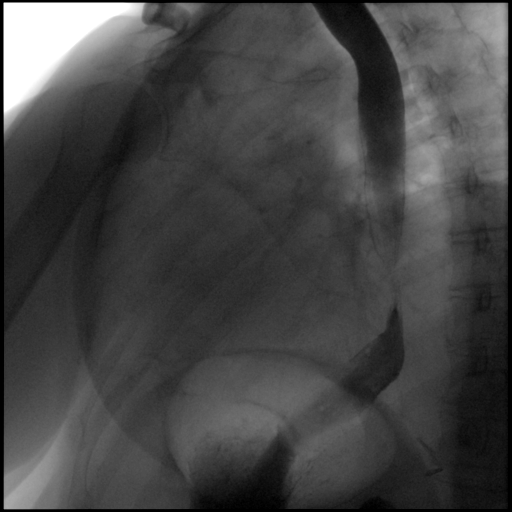

[Series 6: cp_standard · 0.36mm/px · 4 of 297 frames shown (6 of 7)]
[frame 36/297]
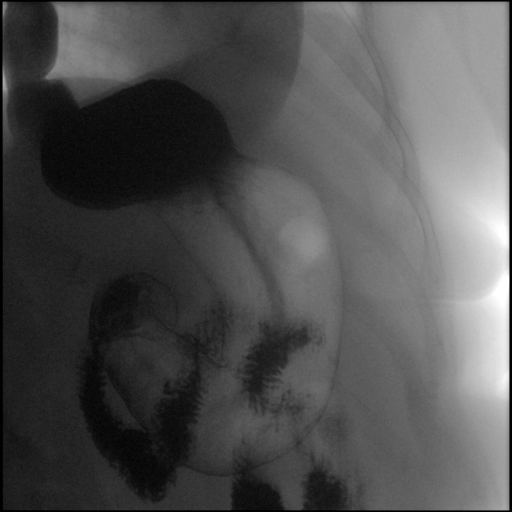
[frame 45/297]
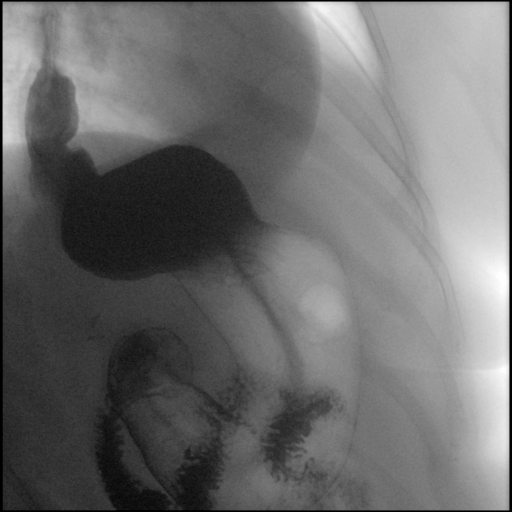
[frame 149/297]
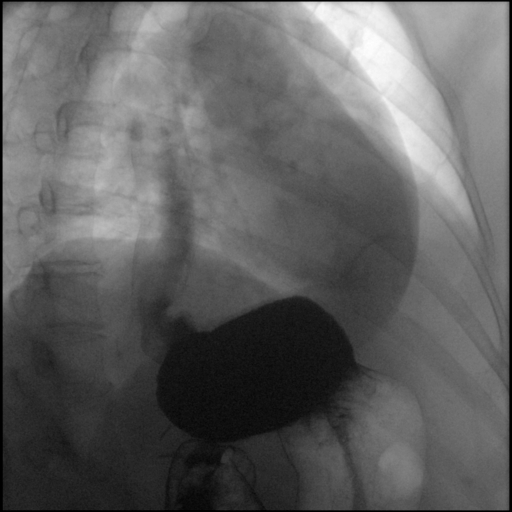
[frame 253/297]
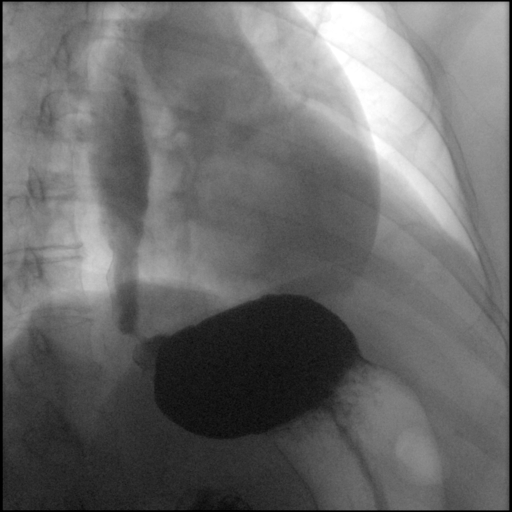

[Series 7: cp_standard · 0.36mm/px · 2 of 230 frames shown (7 of 7)]
[frame 185/230]
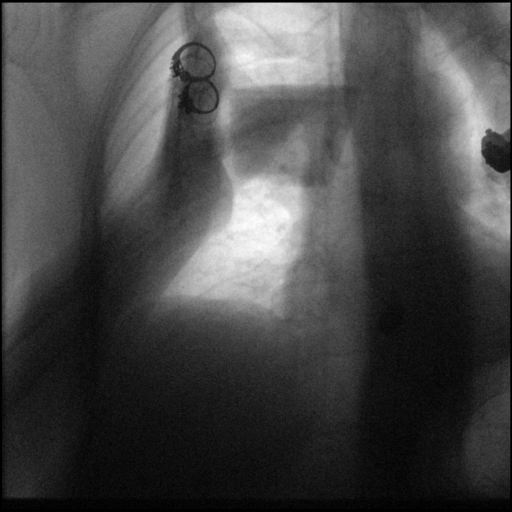
[frame 196/230]
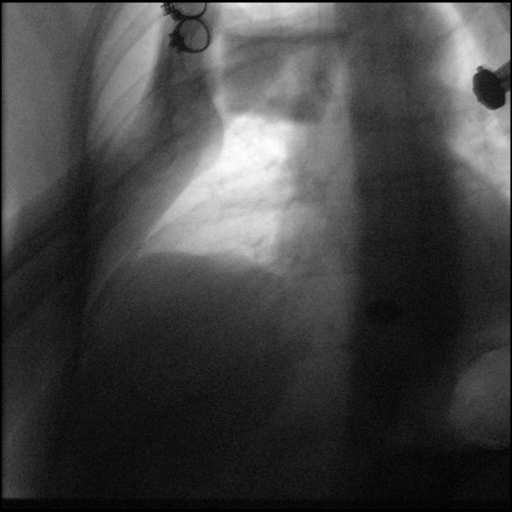

[19 of 24 positions shown; findings below may reference images not displayed]

FINDINGS: Fluoroscopic evaluation of swallowing demonstrates normal esophageal
peristalsis. No fixed stricture, fold thickening or mass. There is
spontaneous gastroesophageal reflux into the mid thoracic esophagus.
The patient swallowed a 13 mm barium tablet which freely passed into
the stomach.
IMPRESSION: Spontaneous gastroesophageal reflux.

No fixed stricture.

## 2021-05-19 DIAGNOSIS — J40 Bronchitis, not specified as acute or chronic: Secondary | ICD-10-CM | POA: Diagnosis not present

## 2021-05-19 DIAGNOSIS — J329 Chronic sinusitis, unspecified: Secondary | ICD-10-CM | POA: Diagnosis not present

## 2021-05-19 DIAGNOSIS — E039 Hypothyroidism, unspecified: Secondary | ICD-10-CM | POA: Diagnosis not present

## 2021-05-19 DIAGNOSIS — Z6824 Body mass index (BMI) 24.0-24.9, adult: Secondary | ICD-10-CM | POA: Diagnosis not present

## 2021-05-27 DIAGNOSIS — Z6824 Body mass index (BMI) 24.0-24.9, adult: Secondary | ICD-10-CM | POA: Diagnosis not present

## 2021-05-27 DIAGNOSIS — B3731 Acute candidiasis of vulva and vagina: Secondary | ICD-10-CM | POA: Diagnosis not present

## 2021-05-27 DIAGNOSIS — L508 Other urticaria: Secondary | ICD-10-CM | POA: Diagnosis not present

## 2021-05-27 DIAGNOSIS — E039 Hypothyroidism, unspecified: Secondary | ICD-10-CM | POA: Diagnosis not present

## 2021-07-02 ENCOUNTER — Ambulatory Visit (INDEPENDENT_AMBULATORY_CARE_PROVIDER_SITE_OTHER): Payer: PPO | Admitting: Gastroenterology

## 2021-07-02 ENCOUNTER — Encounter: Payer: Self-pay | Admitting: Gastroenterology

## 2021-07-02 ENCOUNTER — Other Ambulatory Visit: Payer: Self-pay

## 2021-07-02 VITALS — BP 118/78 | HR 70 | Ht 64.6 in | Wt 144.0 lb

## 2021-07-02 DIAGNOSIS — K219 Gastro-esophageal reflux disease without esophagitis: Secondary | ICD-10-CM

## 2021-07-02 MED ORDER — PANTOPRAZOLE SODIUM 40 MG PO TBEC: 40.0000 mg | DELAYED_RELEASE_TABLET | Freq: Two times a day (BID) | ORAL | 4 refills | Status: DC

## 2021-07-02 NOTE — Progress Notes (Signed)
? ? ?Chief Complaint: FU ? ?Referring Provider:  Serita Grammes, MD    ? ? ?ASSESSMENT AND PLAN;  ? ?#1. GERD with hoarseness. Neg Ba swallow/EGD. Neg CT neck. ENT eval Dr Laurance Flatten- LPR ?#2. RUQ/LUQ pain (resolved) ? ?Plan: ?-Continue protonix 40mg  po BID #180, 4 RF ?-FU as needed ? ?HPI:   ? ?Cindy Ortiz is a 66 y.o. female  ? ?Has been using protonix 40 BID per ENT. ? ?She does feel better. ? ?No further heartburn or dysphagia.  Her hoarseness is better.  She is here for medication refill ? ?She does understand long-term side effects of PPIs particularly osteoporosis. ? ?No Abdo pain. ? ?More constipation on Ca. Planning to add Mg ?  ? ?From previous notes- seen by Dr. Laurance Flatten ENT-negative evaluation including nasopharyngoscopy except for an edema of the arytenoids, likely consistent with reflux.  Advised to increase Protonix to twice daily.  She does feel better as long as she takes it twice a day.  She had CT neck which was unremarkable.  Continues to have hoarseness occ. ? ? ?No weight loss ?Wt Readings from Last 3 Encounters:  ?07/02/21 144 lb (65.3 kg)  ?12/05/20 141 lb (64 kg)  ?10/29/20 139 lb (63 kg)  ? ? ? ?Past GI procedures: ?EGD 08/2019 ?- Small hiatal hernia. ?- Incidental small gastric polyps s/p polypectomy. ?- Gastritis. Biopsied. ? ?Colon 08/2019 ([facility])  ?-Minimal sigmoid diverticulosis. ?-Otherwise normal colonoscopy to TI. ?-Rpt in 10 yrs. Earlier, if with any new problems ? ?Barium swallow 02/2019 ?-Spontaneous reflux ?-No stricture ?-Barium tablet passed without any problems. ? ?SH-works as a Statistician.  2 boys. ?Past Medical History:  ?Diagnosis Date  ? Anxiety   ? Family history of colon cancer   ? Gallstones   ? GERD (gastroesophageal reflux disease)   ? History of colon polyps   ? Hypothyroidism   ? IBS (irritable bowel syndrome)   ? w/ Constipation  ? Lactose intolerance   ? UTI (urinary tract infection)   ? ? ?Past Surgical History:  ?Procedure Laterality  Date  ? CHOLECYSTECTOMY    ? COLONOSCOPY  06/25/2014  ? Mild diverticulosis. Otherwise normal colonoscopy to TI.   ? ESOPHAGOGASTRODUODENOSCOPY  03/03/2017  ? Small transient hiatal hernia. Gastritis. Incidental polyps status post polypectomy.   ? INGUINAL HERNIA REPAIR    ? PARTIAL HYSTERECTOMY  1985  ? RIGHT OOPHORECTOMY  05/1997  ? ? ?Family History  ?Problem Relation Age of Onset  ? Diverticulitis Brother   ? Ovarian cancer Maternal Grandmother   ? Colon cancer Neg Hx   ? Esophageal cancer Neg Hx   ? ? ?Social History  ? ?Tobacco Use  ? Smoking status: Never  ? Smokeless tobacco: Never  ?Vaping Use  ? Vaping Use: Never used  ?Substance Use Topics  ? Alcohol use: Not Currently  ? Drug use: Not Currently  ? ? ?Current Outpatient Medications  ?Medication Sig Dispense Refill  ? estradiol (ESTRACE) 1 MG tablet Take 1 mg by mouth daily.    ? levothyroxine (SYNTHROID) 75 MCG tablet Take 75 mcg by mouth daily.    ? pantoprazole (PROTONIX) 40 MG tablet Take 40 mg by mouth daily.    ? ?No current facility-administered medications for this visit.  ? ? ?Allergies  ?Allergen Reactions  ? Codeine   ?  REACTION: Temporary blindness  ? ? ?Review of Systems:  ?neg ?Psychiatric/Behavioral: No anxiety or depression ? ?  ? ?Physical Exam:   ? ?  BP 118/78   Pulse 70   Ht 5' 4.6" (1.641 m)   Wt 144 lb (65.3 kg)   SpO2 98%   BMI 24.26 kg/m?  ?Filed Weights  ? 07/02/21 1124  ?Weight: 144 lb (65.3 kg)  ? ?Constitutional:  Well-developed, in no acute distress. ?Psychiatric: Normal mood and affect. Behavior is normal. ?HEENT: Pupils normal.  Conjunctivae are normal. No scleral icterus. ?Neck supple.  ?Cardiovascular: Normal rate, regular rhythm. No edema ?Pulmonary/chest: Effort normal and breath sounds normal. No wheezing, rales or rhonchi. ?Abdominal: Soft, nondistended. Nontender. Bowel sounds active throughout. There are no masses palpable. No hepatomegaly. ?Rectal:  defered ?Neurological: Alert and oriented to person place and  time. ?Skin: Skin is warm and dry. No rashes noted. ? ?Data Reviewed: I have personally reviewed following labs and imaging studies ?25 minutes spent with the patient today. Greater than 50% was spent in counseling and coordination of care with the patient ? ? ? ?Carmell Austria, MD 07/02/2021, 11:27 AM ? ?Cc: Serita Grammes, MD ? ? ?

## 2021-07-02 NOTE — Patient Instructions (Addendum)
If you are age 66 or older, your body mass index should be between 23-30. Your Body mass index is 24.26 kg/m?Marland Kitchen If this is out of the aforementioned range listed, please consider follow up with your Primary Care Provider. ? ?If you are age 27 or younger, your body mass index should be between 19-25. Your Body mass index is 24.26 kg/m?Marland Kitchen If this is out of the aformentioned range listed, please consider follow up with your Primary Care Provider.  ? ?________________________________________________________ ? ?The [place] [facility] providers would like to encourage you to use [facility] to communicate with providers for non-urgent requests or questions.  Due to long hold times on the telephone, sending your provider a message by [facility] may be a faster and more efficient way to get a response.  Please allow 48 business hours for a response.  Please remember that this is for non-urgent requests.  ?_______________________________________________________ ? ?Take magnesium (250mg ) daily ? ?Protonix has been sent in to your pharmacy. ? ?Please call with any questions or concerns. ? ?Thank you, ? ?Dr. ? ? ? ?

## 2021-08-20 DIAGNOSIS — Z6824 Body mass index (BMI) 24.0-24.9, adult: Secondary | ICD-10-CM | POA: Diagnosis not present

## 2021-08-20 DIAGNOSIS — L508 Other urticaria: Secondary | ICD-10-CM | POA: Diagnosis not present

## 2021-08-20 DIAGNOSIS — J302 Other seasonal allergic rhinitis: Secondary | ICD-10-CM | POA: Diagnosis not present

## 2021-10-07 DIAGNOSIS — Z6824 Body mass index (BMI) 24.0-24.9, adult: Secondary | ICD-10-CM | POA: Diagnosis not present

## 2021-10-07 DIAGNOSIS — E039 Hypothyroidism, unspecified: Secondary | ICD-10-CM | POA: Diagnosis not present

## 2021-10-07 DIAGNOSIS — R519 Headache, unspecified: Secondary | ICD-10-CM | POA: Diagnosis not present

## 2021-10-07 DIAGNOSIS — M542 Cervicalgia: Secondary | ICD-10-CM | POA: Diagnosis not present

## 2021-10-15 DIAGNOSIS — E039 Hypothyroidism, unspecified: Secondary | ICD-10-CM | POA: Diagnosis not present

## 2021-10-15 DIAGNOSIS — Z6824 Body mass index (BMI) 24.0-24.9, adult: Secondary | ICD-10-CM | POA: Diagnosis not present

## 2021-10-15 DIAGNOSIS — K219 Gastro-esophageal reflux disease without esophagitis: Secondary | ICD-10-CM | POA: Diagnosis not present

## 2021-10-15 DIAGNOSIS — M542 Cervicalgia: Secondary | ICD-10-CM | POA: Diagnosis not present

## 2021-10-15 DIAGNOSIS — E785 Hyperlipidemia, unspecified: Secondary | ICD-10-CM | POA: Diagnosis not present

## 2021-10-15 DIAGNOSIS — Z9181 History of falling: Secondary | ICD-10-CM | POA: Diagnosis not present

## 2021-10-15 DIAGNOSIS — Z131 Encounter for screening for diabetes mellitus: Secondary | ICD-10-CM | POA: Diagnosis not present

## 2021-10-15 DIAGNOSIS — Z79899 Other long term (current) drug therapy: Secondary | ICD-10-CM | POA: Diagnosis not present

## 2021-10-15 DIAGNOSIS — Z0000 Encounter for general adult medical examination without abnormal findings: Secondary | ICD-10-CM | POA: Diagnosis not present

## 2021-10-31 DIAGNOSIS — M542 Cervicalgia: Secondary | ICD-10-CM | POA: Diagnosis not present

## 2021-10-31 DIAGNOSIS — M4312 Spondylolisthesis, cervical region: Secondary | ICD-10-CM | POA: Diagnosis not present

## 2021-10-31 DIAGNOSIS — M47812 Spondylosis without myelopathy or radiculopathy, cervical region: Secondary | ICD-10-CM | POA: Diagnosis not present

## 2021-12-08 DIAGNOSIS — M542 Cervicalgia: Secondary | ICD-10-CM | POA: Diagnosis not present

## 2021-12-15 DIAGNOSIS — M542 Cervicalgia: Secondary | ICD-10-CM | POA: Diagnosis not present

## 2021-12-22 DIAGNOSIS — M542 Cervicalgia: Secondary | ICD-10-CM | POA: Diagnosis not present

## 2021-12-29 DIAGNOSIS — M542 Cervicalgia: Secondary | ICD-10-CM | POA: Diagnosis not present

## 2022-01-01 DIAGNOSIS — L814 Other melanin hyperpigmentation: Secondary | ICD-10-CM | POA: Diagnosis not present

## 2022-01-01 DIAGNOSIS — L820 Inflamed seborrheic keratosis: Secondary | ICD-10-CM | POA: Diagnosis not present

## 2022-01-01 DIAGNOSIS — L578 Other skin changes due to chronic exposure to nonionizing radiation: Secondary | ICD-10-CM | POA: Diagnosis not present

## 2022-01-06 DIAGNOSIS — R1031 Right lower quadrant pain: Secondary | ICD-10-CM | POA: Diagnosis not present

## 2022-01-06 DIAGNOSIS — Z01419 Encounter for gynecological examination (general) (routine) without abnormal findings: Secondary | ICD-10-CM | POA: Diagnosis not present

## 2022-01-06 DIAGNOSIS — Z1231 Encounter for screening mammogram for malignant neoplasm of breast: Secondary | ICD-10-CM | POA: Diagnosis not present

## 2022-01-06 DIAGNOSIS — Z6824 Body mass index (BMI) 24.0-24.9, adult: Secondary | ICD-10-CM | POA: Diagnosis not present

## 2022-01-06 DIAGNOSIS — N959 Unspecified menopausal and perimenopausal disorder: Secondary | ICD-10-CM | POA: Diagnosis not present

## 2022-01-06 DIAGNOSIS — F419 Anxiety disorder, unspecified: Secondary | ICD-10-CM | POA: Diagnosis not present

## 2022-01-06 DIAGNOSIS — M542 Cervicalgia: Secondary | ICD-10-CM | POA: Diagnosis not present

## 2022-02-24 DIAGNOSIS — M542 Cervicalgia: Secondary | ICD-10-CM | POA: Diagnosis not present

## 2022-02-24 DIAGNOSIS — M4312 Spondylolisthesis, cervical region: Secondary | ICD-10-CM | POA: Diagnosis not present

## 2022-03-09 DIAGNOSIS — M5412 Radiculopathy, cervical region: Secondary | ICD-10-CM | POA: Diagnosis not present

## 2022-03-09 DIAGNOSIS — Z5321 Procedure and treatment not carried out due to patient leaving prior to being seen by health care provider: Secondary | ICD-10-CM | POA: Diagnosis not present

## 2022-03-13 DIAGNOSIS — M25519 Pain in unspecified shoulder: Secondary | ICD-10-CM | POA: Diagnosis not present

## 2022-03-13 DIAGNOSIS — M5021 Other cervical disc displacement,  high cervical region: Secondary | ICD-10-CM | POA: Diagnosis not present

## 2022-03-13 DIAGNOSIS — M4802 Spinal stenosis, cervical region: Secondary | ICD-10-CM | POA: Diagnosis not present

## 2022-03-13 DIAGNOSIS — M50222 Other cervical disc displacement at C5-C6 level: Secondary | ICD-10-CM | POA: Diagnosis not present

## 2022-03-24 DIAGNOSIS — H9203 Otalgia, bilateral: Secondary | ICD-10-CM | POA: Diagnosis not present

## 2022-03-24 DIAGNOSIS — R519 Headache, unspecified: Secondary | ICD-10-CM | POA: Diagnosis not present

## 2022-03-24 DIAGNOSIS — U071 COVID-19: Secondary | ICD-10-CM | POA: Diagnosis not present

## 2022-03-24 DIAGNOSIS — R509 Fever, unspecified: Secondary | ICD-10-CM | POA: Diagnosis not present

## 2022-05-11 DIAGNOSIS — Z6824 Body mass index (BMI) 24.0-24.9, adult: Secondary | ICD-10-CM | POA: Diagnosis not present

## 2022-05-11 DIAGNOSIS — R350 Frequency of micturition: Secondary | ICD-10-CM | POA: Diagnosis not present

## 2022-05-11 DIAGNOSIS — E039 Hypothyroidism, unspecified: Secondary | ICD-10-CM | POA: Diagnosis not present

## 2022-05-11 DIAGNOSIS — N9089 Other specified noninflammatory disorders of vulva and perineum: Secondary | ICD-10-CM | POA: Diagnosis not present

## 2022-06-01 DIAGNOSIS — Z6824 Body mass index (BMI) 24.0-24.9, adult: Secondary | ICD-10-CM | POA: Diagnosis not present

## 2022-06-01 DIAGNOSIS — B0052 Herpesviral keratitis: Secondary | ICD-10-CM | POA: Diagnosis not present

## 2022-06-01 DIAGNOSIS — N9089 Other specified noninflammatory disorders of vulva and perineum: Secondary | ICD-10-CM | POA: Diagnosis not present

## 2022-06-08 DIAGNOSIS — N9089 Other specified noninflammatory disorders of vulva and perineum: Secondary | ICD-10-CM | POA: Diagnosis not present

## 2022-08-04 ENCOUNTER — Encounter: Payer: Self-pay | Admitting: Gastroenterology

## 2022-08-31 ENCOUNTER — Encounter: Payer: Self-pay | Admitting: Gastroenterology

## 2022-08-31 ENCOUNTER — Ambulatory Visit (INDEPENDENT_AMBULATORY_CARE_PROVIDER_SITE_OTHER): Payer: PPO | Admitting: Gastroenterology

## 2022-08-31 ENCOUNTER — Other Ambulatory Visit (INDEPENDENT_AMBULATORY_CARE_PROVIDER_SITE_OTHER): Payer: PPO

## 2022-08-31 VITALS — BP 118/76 | HR 51 | Ht 64.0 in | Wt 142.0 lb

## 2022-08-31 DIAGNOSIS — K219 Gastro-esophageal reflux disease without esophagitis: Secondary | ICD-10-CM | POA: Diagnosis not present

## 2022-08-31 DIAGNOSIS — R490 Dysphonia: Secondary | ICD-10-CM

## 2022-08-31 DIAGNOSIS — R1012 Left upper quadrant pain: Secondary | ICD-10-CM | POA: Diagnosis not present

## 2022-08-31 LAB — COMPREHENSIVE METABOLIC PANEL
ALT: 12 U/L (ref 0–35)
AST: 18 U/L (ref 0–37)
Albumin: 4 g/dL (ref 3.5–5.2)
Alkaline Phosphatase: 49 U/L (ref 39–117)
BUN: 17 mg/dL (ref 6–23)
CO2: 28 mEq/L (ref 19–32)
Calcium: 9 mg/dL (ref 8.4–10.5)
Chloride: 105 mEq/L (ref 96–112)
Creatinine, Ser: 0.95 mg/dL (ref 0.40–1.20)
GFR: 62.14 mL/min (ref >60.00)
Glucose, Bld: 89 mg/dL (ref 70–99)
Potassium: 4 mEq/L (ref 3.5–5.1)
Sodium: 140 mEq/L (ref 135–145)
Total Bilirubin: 0.6 mg/dL (ref 0.2–1.2)
Total Protein: 6.6 g/dL (ref 6.0–8.3)

## 2022-08-31 LAB — CBC WITH DIFFERENTIAL/PLATELET
Basophils Absolute: 0 10*3/uL (ref 0.0–0.1)
Basophils Relative: 0.5 % (ref 0.0–3.0)
Eosinophils Absolute: 0.5 10*3/uL (ref 0.0–0.7)
Eosinophils Relative: 8.6 % — ABNORMAL HIGH (ref 0.0–5.0)
HCT: 41 % (ref 36.0–46.0)
Hemoglobin: 14 g/dL (ref 12.0–15.0)
Lymphocytes Relative: 35.2 % (ref 12.0–46.0)
Lymphs Abs: 2.2 10*3/uL (ref 0.7–4.0)
MCHC: 34.2 g/dL (ref 30.0–36.0)
MCV: 96.2 fl (ref 78.0–100.0)
Monocytes Absolute: 0.5 10*3/uL (ref 0.1–1.0)
Monocytes Relative: 7.7 % (ref 3.0–12.0)
Neutro Abs: 3 10*3/uL (ref 1.4–7.7)
Neutrophils Relative %: 48 % (ref 43.0–77.0)
Platelets: 211 10*3/uL (ref 150.0–400.0)
RBC: 4.26 Mil/uL (ref 3.87–5.11)
RDW: 13.7 % (ref 11.5–15.5)
WBC: 6.2 10*3/uL (ref 4.0–10.5)

## 2022-08-31 MED ORDER — PANTOPRAZOLE SODIUM 40 MG PO TBEC: 40.0000 mg | DELAYED_RELEASE_TABLET | Freq: Two times a day (BID) | ORAL | 4 refills | Status: DC

## 2022-08-31 NOTE — Patient Instructions (Addendum)
-   We have sent the following medications to your pharmacy for you to pick up at your convenience:  Protonix 40 MG - Take 1 tablet twice daily.  - Your provider has requested that you go to the basement level for lab work before leaving today. Press "B" on the elevator. The lab is located at the first door on the left as you exit the elevator.  - You have been scheduled for a CT scan of the abdomen and pelvis at [facility] [place], [place] [address]  [place], [place] [address]) on 09/21/22 at 12:00 am. You should arrive 9:45 AM prior to your appointment time to drink contrast. Please follow the written instructions below on the day of your exam:  This test typically takes 30-45 minutes to complete.  If you have any questions regarding your exam or if you need to reschedule, you may call the CT department at [phone] between the hours of 8:00 am and 5:00 pm, Monday-Friday.  You have already scheduled EGD on 10/19/22 with Dr. Chales Abrahams.   ________________________________________________________________________   Follow up as needed.   _______________________________________________________  If your blood pressure at your visit was 140/90 or greater, please contact your primary care physician to follow up on this.  _______________________________________________________  If you are age 79 or older, your body mass index should be between 23-30. Your Body mass index is 24.37 kg/m. If this is out of the aforementioned range listed, please consider follow up with your Primary Care Provider.  __________________________________________________________  The [place] [facility] providers would like to encourage you to use [facility] to communicate with providers for non-urgent requests or questions.  Due to long hold times on the telephone, sending your provider a message by [facility] may be a faster and more efficient way to get a response.  Please allow 48 business hours for a response.   Please remember that this is for non-urgent requests.    Due to recent changes in healthcare laws, you may see the results of your imaging and laboratory studies on MyChart before your provider has had a chance to review them.  We understand that in some cases there may be results that are confusing or concerning to you. Not all laboratory results come back in the same time frame and the provider may be waiting for multiple results in order to interpret others.  Please give [place] 48 hours in order for your provider to thoroughly review all the results before contacting the office for clarification of your results.

## 2022-08-31 NOTE — Progress Notes (Signed)
Chief Complaint: FU  Referring Provider:  Buckner Malta, MD      ASSESSMENT AND PLAN;   #1. LUQ pain. Neg colon 08/2019   #2. GERD with hoarseness. Neg Ba swallow/EGD. Neg CT neck. ENT eval Dr Christell Constant- [facility].   #3. Gastric intestinal metaplasia(IM)  Plan: -Continue protonix 40mg  po BID #180, 4 RF. Try to cut down to QD.  -CBC, CMP, lipase -CT AP with contrast -EGD (with gastric mapping) for further eva of IM -FU therafter  HPI:    Cindy Ortiz is a 67 y.o. female   LUQ pain -Intermittent -Does wake her up lately. She has to walk around -Worse after eating  Has been using protonix 40 BID per ENT. Hoarseness is better.  The biopsies did show intestinal metaplasia-which only very rarely turns into any cancer.  Here for EGD with biopsies  No further heartburn or dysphagia.  Her hoarseness is better.  She is here for medication refill  She does understand long-term side effects of PPIs particularly osteoporosis.  Willing to try it once a day  Constipation is better    From previous notes- seen by Dr. Christell Constant ENT-negative evaluation including nasopharyngoscopy except for an edema of the arytenoids, likely consistent with reflux.  Advised to increase Protonix to twice daily.  She does feel better as long as she takes it twice a day.  She had CT neck which was unremarkable.  Continues to have hoarseness occ.   No weight loss Wt Readings from Last 3 Encounters:  08/31/22 142 lb (64.4 kg)  07/02/21 144 lb (65.3 kg)  12/05/20 141 lb (64 kg)     Past GI procedures: EGD 08/2019 - Small hiatal hernia. - Incidental small gastric polyps s/p polypectomy. - Gastritis. Biopsied. 1. Surgical [P], gastric antrum and gastric body - CHRONIC GASTRITIS WITH INTESTINAL METAPLASIA AND REACTIVE CHANGES - SEE COMMENT 2. Surgical [P], gastric polyps - CHRONIC GASTRITIS WITH INTESTINAL METAPLASIA AND REACTIVE CHANGES - SEE COMMENT 3. Surgical [P], esophagus bx - REACTIVE  SQUAMOUS MUCOSA WITH INCREASED INTRAEPITHELIAL LYMPHOCYTES AND RARE EOSINOPHILS - SEE COMMENT  Colon 08/2019 ([facility])  -Minimal sigmoid diverticulosis. -Otherwise normal colonoscopy to TI. -Rpt in 10 yrs. Earlier, if with any new problems  Barium swallow 02/2019 -Spontaneous reflux -No stricture -Barium tablet passed without any problems.  SH-works as a Designer, television/film set.  2 boys. Past Medical History:  Diagnosis Date   Anxiety    Family history of colon cancer    Gallstones    GERD (gastroesophageal reflux disease)    History of colon polyps    Hypothyroidism    IBS (irritable bowel syndrome)    w/ Constipation   Lactose intolerance    UTI (urinary tract infection)     Past Surgical History:  Procedure Laterality Date   CHOLECYSTECTOMY     COLONOSCOPY  06/25/2014   Mild diverticulosis. Otherwise normal colonoscopy to TI.    ESOPHAGOGASTRODUODENOSCOPY  03/03/2017   Small transient hiatal hernia. Gastritis. Incidental polyps status post polypectomy.    INGUINAL HERNIA REPAIR     PARTIAL HYSTERECTOMY  1985   RIGHT OOPHORECTOMY  05/1997    Family History  Problem Relation Age of Onset   Diverticulitis Brother    Ovarian cancer Maternal Grandmother    Colon cancer Neg Hx    Esophageal cancer Neg Hx     Social History   Tobacco Use   Smoking status: Never   Smokeless tobacco: Never  Vaping Use   Vaping Use:  Never used  Substance Use Topics   Alcohol use: Not Currently   Drug use: Not Currently    Current Outpatient Medications  Medication Sig Dispense Refill   estradiol (ESTRACE) 1 MG tablet Take 1 mg by mouth daily.     levothyroxine (SYNTHROID) 75 MCG tablet Take 75 mcg by mouth daily.     pantoprazole (PROTONIX) 40 MG tablet Take 1 tablet (40 mg total) by mouth 2 (two) times daily. 180 tablet 4   No current facility-administered medications for this visit.    Allergies  Allergen Reactions   Codeine     REACTION: Temporary blindness     Review of Systems:  neg Psychiatric/Behavioral: No anxiety or depression     Physical Exam:    BP 118/76   Pulse (!) 51   Ht 5\' 4"  (1.626 m)   Wt 142 lb (64.4 kg)   BMI 24.37 kg/m  Filed Weights   08/31/22 0905  Weight: 142 lb (64.4 kg)   Constitutional:  Well-developed, in no acute distress. Psychiatric: Normal mood and affect. Behavior is normal. HEENT: Pupils normal.  Conjunctivae are normal. No scleral icterus. Cardiovascular: Normal rate, regular rhythm. No edema Pulmonary/chest: Effort normal and breath sounds normal. No wheezing, rales or rhonchi. Abdominal: Soft, nondistended.  Nontender. Bowel sounds active throughout. There are no masses palpable. No hepatomegaly. Rectal:  defered Neurological: Alert and oriented to person place and time. Skin: Skin is warm and dry. No rashes noted.    Edman Circle, MD 08/31/2022, 9:14 AM  Cc: Buckner Malta, MD

## 2022-09-11 ENCOUNTER — Other Ambulatory Visit (HOSPITAL_COMMUNITY): Payer: PPO

## 2022-09-21 ENCOUNTER — Ambulatory Visit (HOSPITAL_COMMUNITY)
Admission: RE | Admit: 2022-09-21 | Discharge: 2022-09-21 | Disposition: A | Discharge status: Home / Self Care | Payer: PPO | Source: Ambulatory Visit | Attending: Gastroenterology | Admitting: Gastroenterology

## 2022-09-21 DIAGNOSIS — R1011 Right upper quadrant pain: Secondary | ICD-10-CM | POA: Diagnosis not present

## 2022-09-21 DIAGNOSIS — N281 Cyst of kidney, acquired: Secondary | ICD-10-CM | POA: Diagnosis not present

## 2022-09-21 DIAGNOSIS — R1012 Left upper quadrant pain: Secondary | ICD-10-CM | POA: Diagnosis not present

## 2022-09-21 DIAGNOSIS — R490 Dysphonia: Secondary | ICD-10-CM | POA: Insufficient documentation

## 2022-09-21 DIAGNOSIS — K219 Gastro-esophageal reflux disease without esophagitis: Secondary | ICD-10-CM | POA: Diagnosis not present

## 2022-09-21 DIAGNOSIS — K769 Liver disease, unspecified: Secondary | ICD-10-CM | POA: Diagnosis not present

## 2022-09-21 MED ORDER — IOHEXOL 350 MG/ML SOLN
75.0000 mL | Freq: Once | INTRAVENOUS | Status: AC | PRN
  Administered 2022-09-21: 75 mL via INTRAVENOUS

## 2022-10-05 DIAGNOSIS — H25813 Combined forms of age-related cataract, bilateral: Secondary | ICD-10-CM | POA: Diagnosis not present

## 2022-10-05 DIAGNOSIS — H43813 Vitreous degeneration, bilateral: Secondary | ICD-10-CM | POA: Diagnosis not present

## 2022-10-05 DIAGNOSIS — H5213 Myopia, bilateral: Secondary | ICD-10-CM | POA: Diagnosis not present

## 2022-10-05 DIAGNOSIS — H52223 Regular astigmatism, bilateral: Secondary | ICD-10-CM | POA: Diagnosis not present

## 2022-10-05 DIAGNOSIS — H524 Presbyopia: Secondary | ICD-10-CM | POA: Diagnosis not present

## 2022-10-13 ENCOUNTER — Encounter: Payer: Self-pay | Admitting: Certified Registered Nurse Anesthetist

## 2022-10-19 ENCOUNTER — Ambulatory Visit (AMBULATORY_SURGERY_CENTER): Payer: PPO | Admitting: Gastroenterology

## 2022-10-19 ENCOUNTER — Encounter: Payer: Self-pay | Admitting: Gastroenterology

## 2022-10-19 VITALS — BP 103/62 | HR 58 | Temp 98.4°F | Resp 16 | Ht 64.0 in | Wt 142.0 lb

## 2022-10-19 DIAGNOSIS — K2951 Unspecified chronic gastritis with bleeding: Secondary | ICD-10-CM | POA: Diagnosis not present

## 2022-10-19 DIAGNOSIS — R1012 Left upper quadrant pain: Secondary | ICD-10-CM

## 2022-10-19 DIAGNOSIS — E039 Hypothyroidism, unspecified: Secondary | ICD-10-CM | POA: Diagnosis not present

## 2022-10-19 DIAGNOSIS — K317 Polyp of stomach and duodenum: Secondary | ICD-10-CM

## 2022-10-19 DIAGNOSIS — K2289 Other specified disease of esophagus: Secondary | ICD-10-CM | POA: Diagnosis not present

## 2022-10-19 DIAGNOSIS — K2970 Gastritis, unspecified, without bleeding: Secondary | ICD-10-CM

## 2022-10-19 DIAGNOSIS — F419 Anxiety disorder, unspecified: Secondary | ICD-10-CM | POA: Diagnosis not present

## 2022-10-19 MED ORDER — SODIUM CHLORIDE 0.9 % IV SOLN
500.0000 mL | Freq: Once | INTRAVENOUS | Status: AC
Start: 2022-10-19 — End: ?

## 2022-10-19 NOTE — Progress Notes (Signed)
Chief Complaint: FU  Referring Provider:  Buckner Malta, MD      ASSESSMENT AND PLAN;   #1. LUQ pain. Neg colon 08/2019   #2. GERD with hoarseness. Neg Ba swallow/EGD. Neg CT neck. ENT eval Dr Christell Constant- [facility].   #3. Gastric intestinal metaplasia(IM)  Plan: -Continue protonix 40mg  po BID #180, 4 RF. Try to cut down to QD.  -CBC, CMP, lipase -CT AP with contrast -EGD (with gastric mapping) for further eva of IM -FU therafter  HPI:    Cindy Ortiz is a 67 y.o. female   LUQ pain -Intermittent -Does wake her up lately. She has to walk around -Worse after eating  Has been using protonix 40 BID per ENT. Hoarseness is better.  The biopsies did show intestinal metaplasia-which only very rarely turns into any cancer.  Here for EGD with biopsies  No further heartburn or dysphagia.  Her hoarseness is better.  She is here for medication refill  She does understand long-term side effects of PPIs particularly osteoporosis.  Willing to try it once a day  Constipation is better    From previous notes- seen by Dr. Christell Constant ENT-negative evaluation including nasopharyngoscopy except for an edema of the arytenoids, likely consistent with reflux.  Advised to increase Protonix to twice daily.  She does feel better as long as she takes it twice a day.  She had CT neck which was unremarkable.  Continues to have hoarseness occ.   No weight loss Wt Readings from Last 3 Encounters:  10/19/22 142 lb (64.4 kg)  08/31/22 142 lb (64.4 kg)  07/02/21 144 lb (65.3 kg)     Past GI procedures: EGD 08/2019 - Small hiatal hernia. - Incidental small gastric polyps s/p polypectomy. - Gastritis. Biopsied. 1. Surgical [P], gastric antrum and gastric body - CHRONIC GASTRITIS WITH INTESTINAL METAPLASIA AND REACTIVE CHANGES - SEE COMMENT 2. Surgical [P], gastric polyps - CHRONIC GASTRITIS WITH INTESTINAL METAPLASIA AND REACTIVE CHANGES - SEE COMMENT 3. Surgical [P], esophagus bx - REACTIVE  SQUAMOUS MUCOSA WITH INCREASED INTRAEPITHELIAL LYMPHOCYTES AND RARE EOSINOPHILS - SEE COMMENT  Colon 08/2019 ([facility])  -Minimal sigmoid diverticulosis. -Otherwise normal colonoscopy to TI. -Rpt in 10 yrs. Earlier, if with any new problems  Barium swallow 02/2019 -Spontaneous reflux -No stricture -Barium tablet passed without any problems.  SH-works as a Designer, television/film set.  2 boys. Past Medical History:  Diagnosis Date   Anxiety    Family history of colon cancer    Gallstones    GERD (gastroesophageal reflux disease)    History of colon polyps    Hypothyroidism    IBS (irritable bowel syndrome)    w/ Constipation   Lactose intolerance    UTI (urinary tract infection)     Past Surgical History:  Procedure Laterality Date   CHOLECYSTECTOMY     COLONOSCOPY  06/25/2014   Mild diverticulosis. Otherwise normal colonoscopy to TI.    ESOPHAGOGASTRODUODENOSCOPY  03/03/2017   Small transient hiatal hernia. Gastritis. Incidental polyps status post polypectomy.    INGUINAL HERNIA REPAIR     PARTIAL HYSTERECTOMY  1985   RIGHT OOPHORECTOMY  05/1997    Family History  Problem Relation Age of Onset   Diverticulitis Brother    Colon cancer Maternal Grandmother    Ovarian cancer Maternal Grandmother    Esophageal cancer Neg Hx     Social History   Tobacco Use   Smoking status: Never   Smokeless tobacco: Never  Vaping Use   Vaping Use:  Never used  Substance Use Topics   Alcohol use: Not Currently   Drug use: Not Currently    Current Outpatient Medications  Medication Sig Dispense Refill   estradiol (ESTRACE) 1 MG tablet Take 1 mg by mouth daily.     levothyroxine (SYNTHROID) 75 MCG tablet Take 75 mcg by mouth daily.     pantoprazole (PROTONIX) 40 MG tablet Take 1 tablet (40 mg total) by mouth 2 (two) times daily. 180 tablet 4   ALPRAZolam (XANAX) 0.25 MG tablet Take 1 tablet by mouth every 8 (eight) hours as needed. (Patient not taking: Reported on  10/19/2022)     ALPRAZolam (XANAX) 0.5 MG tablet      Current Facility-Administered Medications  Medication Dose Route Frequency Provider Last Rate Last Admin   0.9 %  sodium chloride infusion  500 mL Intravenous Once Lynann Bologna, MD        Allergies  Allergen Reactions   Codeine     REACTION: Temporary blindness    Review of Systems:  neg Psychiatric/Behavioral: No anxiety or depression     Physical Exam:    BP 124/67   Pulse (!) 57   Temp 98.4 F (36.9 C)   Ht 5\' 4"  (1.626 m)   Wt 142 lb (64.4 kg)   SpO2 99%   BMI 24.37 kg/m  Filed Weights   10/19/22 0918  Weight: 142 lb (64.4 kg)   Constitutional:  Well-developed, in no acute distress. Psychiatric: Normal mood and affect. Behavior is normal. HEENT: Pupils normal.  Conjunctivae are normal. No scleral icterus. Cardiovascular: Normal rate, regular rhythm. No edema Pulmonary/chest: Effort normal and breath sounds normal. No wheezing, rales or rhonchi. Abdominal: Soft, nondistended.  Nontender. Bowel sounds active throughout. There are no masses palpable. No hepatomegaly. Rectal:  defered Neurological: Alert and oriented to person place and time. Skin: Skin is warm and dry. No rashes noted.    Edman Circle, MD 10/19/2022, 10:03 AM  Cc: Buckner Malta, MD

## 2022-10-19 NOTE — Patient Instructions (Signed)
YOU HAD AN ENDOSCOPIC PROCEDURE TODAY AT THE [facility]:   Refer to the procedure report that was given to you for any specific questions about what was found during the examination.  If the procedure report does not answer your questions, please call your gastroenterologist to clarify.  If you requested that your care partner not be given the details of your procedure findings, then the procedure report has been included in a sealed envelope for you to review at your convenience later.  YOU SHOULD EXPECT: Some feelings of bloating in the abdomen. Passage of more gas than usual.  Walking can help get rid of the air that was put into your GI tract during the procedure and reduce the bloating. If you had a lower endoscopy (such as a colonoscopy or flexible sigmoidoscopy) you may notice spotting of blood in your stool or on the toilet paper. If you underwent a bowel prep for your procedure, you may not have a normal bowel movement for a few days.  Please Note:  You might notice some irritation and congestion in your nose or some drainage.  This is from the oxygen used during your procedure.  There is no need for concern and it should clear up in a day or so.  SYMPTOMS TO REPORT IMMEDIATELY:  Following upper endoscopy (EGD)  Vomiting of blood or coffee ground material  New chest pain or pain under the shoulder blades  Painful or persistently difficult swallowing  New shortness of breath  Fever of 100F or higher  Black, tarry-looking stools  For urgent or emergent issues, a gastroenterologist can be reached at any hour by calling [phone]. Do not use MyChart messaging for urgent concerns.    DIET:  Follow a POST-DILATION DIET (see handout given to you by your recovery nurse): NOTHING TO EAT OR DRINK UNTIL 11:30 am, then you may have CLEAR LIQUIDS ONLY from 11:30 am-12:30 pm, then you may have a SOFT DIET for the rest of the day today beginning at 12:30pm for the rest of the day  today. You may proceed to your regular diet as tolerated tomorrow morning.  Drink plenty of fluids but you should avoid alcoholic beverages for 24 hours.  MEDICATIONS: Continue present medications. You can decrease your Protonix to ONCE DAILY.  FOLLOW UP: Await pathology results from today's procedure.  Please see all handouts given to you by your recovery nurse: Post-dilation diet, Hiatal Hernia, Gastritis.  Thank you for allowing [place] to provide for your healthcare needs today.  ACTIVITY:  You should plan to take it easy for the rest of today and you should NOT DRIVE or use heavy machinery until tomorrow (because of the sedation medicines used during the test).    FOLLOW UP: Our staff will call the number listed on your records the next business day following your procedure.  We will call around 7:15- 8:00 am to check on you and address any questions or concerns that you may have regarding the information given to you following your procedure. If we do not reach you, we will leave a message.     If any biopsies were taken you will be contacted by phone or by letter within the next 1-3 weeks.  Please call us at [phone] if you have not heard about the biopsies in 3 weeks.    SIGNATURES/CONFIDENTIALITY: You and/or your care partner have signed paperwork which will be entered into your electronic medical record.  These signatures attest to the fact  that that the information above on your After Visit Summary has been reviewed and is understood.  Full responsibility of the confidentiality of this discharge information lies with you and/or your care-partner.

## 2022-10-19 NOTE — Op Note (Signed)
[facility] Patient Name: Cindy Ortiz Procedure Date: 10/19/2022 10:02 AM MRN: [id number] Endoscopist: Lynann Bologna , MD, [id number] Age: 67 Referring MD:  Date of Birth: May 21, 1955 Gender: Female Account #: [id number] Procedure:                Upper GI endoscopy Indications:              Dysphagia. Prev Dx gastric intestinal metaplasia                            for gastric mapping. Medicines:                Monitored Anesthesia Care Procedure:                Pre-Anesthesia Assessment:                           - Prior to the procedure, a History and Physical                            was performed, and patient medications and                            allergies were reviewed. The patient's tolerance of                            previous anesthesia was also reviewed. The risks                            and benefits of the procedure and the sedation                            options and risks were discussed with the patient.                            All questions were answered, and informed consent                            was obtained. Prior Anticoagulants: The patient has                            taken no anticoagulant or antiplatelet agents. ASA                            Grade Assessment: II - A patient with mild systemic                            disease. After reviewing the risks and benefits,                            the patient was deemed in satisfactory condition to                            undergo the procedure.  After obtaining informed consent, the endoscope was                            passed under direct vision. Throughout the                            procedure, the patient's blood pressure, pulse, and                            oxygen saturations were monitored continuously. The                            Olympus Scope [id number] was introduced through the                            mouth, and advanced to the  second part of duodenum.                            The upper GI endoscopy was accomplished without                            difficulty. The patient tolerated the procedure                            well. Scope In: Scope Out: Findings:                 The lower third of the esophagus was mildly                            tortuous. The scope was withdrawn. Dilation was                            performed with a Maloney dilator with mild                            resistance at 50 Fr and 52 Fr. The esophagus was                            not biopsied since previous biopsies were neg for                            EoE                           A small hiatal hernia was present.                           Diffuse mild inflammation characterized by erythema                            was found in the entire examined stomach. Multiple                            biopsies were taken with  a cold forceps using                            Sydney protocol (jar 1-antrum greater curvature,                            jar 2-antrum LC, jar 3-angularis, jar 4-body GC,                            jar 5- body LC, jar 6-fundus)                           A single 6 mm sessile polyp with no stigmata of                            recent bleeding was found in the gastric body. The                            polyp was removed with a cold snare (jar 7).                            Resection and retrieval were complete.                           The examined duodenum was normal. Complications:            No immediate complications. Estimated Blood Loss:     Estimated blood loss: none. Impression:               - Mild presbyesophagus - dilated.                           - Small hiatal hernia.                           - Atrophic gastritis. Biopsied.                           - A single gastric polyp. Resected and retrieved.                           - Normal examined duodenum. Recommendation:           - Patient  has a contact number available for                            emergencies. The signs and symptoms of potential                            delayed complications were discussed with the                            patient. Return to normal activities tomorrow.                            Written discharge instructions were provided to the  patient.                           - Resume previous diet.                           - Continue present medications. Can reduce Protonix                            to QD                           - Await pathology results.                           - The findings and recommendations were discussed                            with the patient's family. Lynann Bologna, MD 10/19/2022 10:35:01 AM This report has been signed electronically.

## 2022-10-19 NOTE — Progress Notes (Signed)
Report given to PACU, vss 

## 2022-10-19 NOTE — Progress Notes (Signed)
Called to room to assist during endoscopic procedure.  Patient ID and intended procedure confirmed with present staff. Received instructions for my participation in the procedure from the performing physician.  Female staff member present for the entire case.  

## 2022-10-19 NOTE — Progress Notes (Signed)
1007 Robinul 0.1 mg IV given due large amount of secretions upon assessment.  MD made aware, vss 

## 2022-10-19 NOTE — Progress Notes (Signed)
Pt's states no medical or surgical changes since previsit or office visit. 

## 2022-10-20 ENCOUNTER — Telehealth: Payer: Self-pay | Admitting: *Deleted

## 2022-10-20 NOTE — Telephone Encounter (Signed)
  Follow up Call-     10/19/2022    9:22 AM  Call back number  Post procedure Call Back phone  # [phone]  Permission to leave phone message Yes     Patient questions:  Do you have a fever, pain , or abdominal swelling? No. Pain Score  0 *  Have you tolerated food without any problems? Yes.    Have you been able to return to your normal activities? Yes.    Do you have any questions about your discharge instructions: Diet   No. Medications  No. Follow up visit  No.  Do you have questions or concerns about your Care? No.  Actions: * If pain score is 4 or above: No action needed, pain <4.pt dis say she has had Upper Endo procedure done before and doesn't remember her throat hurting as much as it has this time . Encouraged pt to drink liquids keeping throat moist today and call us back if doesn't improve today.Pt denies bleeding ,fever,or chest discomfort.

## 2022-11-13 ENCOUNTER — Encounter: Payer: Self-pay | Admitting: Gastroenterology

## 2022-11-30 DIAGNOSIS — Z79899 Other long term (current) drug therapy: Secondary | ICD-10-CM | POA: Diagnosis not present

## 2022-11-30 DIAGNOSIS — R252 Cramp and spasm: Secondary | ICD-10-CM | POA: Diagnosis not present

## 2022-11-30 DIAGNOSIS — Z6829 Body mass index (BMI) 29.0-29.9, adult: Secondary | ICD-10-CM | POA: Diagnosis not present

## 2022-11-30 DIAGNOSIS — R7302 Impaired glucose tolerance (oral): Secondary | ICD-10-CM | POA: Diagnosis not present

## 2022-11-30 DIAGNOSIS — E785 Hyperlipidemia, unspecified: Secondary | ICD-10-CM | POA: Diagnosis not present

## 2022-11-30 DIAGNOSIS — Z0000 Encounter for general adult medical examination without abnormal findings: Secondary | ICD-10-CM | POA: Diagnosis not present

## 2022-11-30 DIAGNOSIS — Z1331 Encounter for screening for depression: Secondary | ICD-10-CM | POA: Diagnosis not present

## 2022-11-30 DIAGNOSIS — Z1339 Encounter for screening examination for other mental health and behavioral disorders: Secondary | ICD-10-CM | POA: Diagnosis not present

## 2022-11-30 DIAGNOSIS — E039 Hypothyroidism, unspecified: Secondary | ICD-10-CM | POA: Diagnosis not present

## 2023-03-01 DIAGNOSIS — Z124 Encounter for screening for malignant neoplasm of cervix: Secondary | ICD-10-CM | POA: Diagnosis not present

## 2023-03-01 DIAGNOSIS — Z1272 Encounter for screening for malignant neoplasm of vagina: Secondary | ICD-10-CM | POA: Diagnosis not present

## 2023-03-01 DIAGNOSIS — Z23 Encounter for immunization: Secondary | ICD-10-CM | POA: Diagnosis not present

## 2023-03-01 DIAGNOSIS — R413 Other amnesia: Secondary | ICD-10-CM | POA: Diagnosis not present

## 2023-03-01 DIAGNOSIS — Z79899 Other long term (current) drug therapy: Secondary | ICD-10-CM | POA: Diagnosis not present

## 2023-03-01 DIAGNOSIS — N959 Unspecified menopausal and perimenopausal disorder: Secondary | ICD-10-CM | POA: Diagnosis not present

## 2023-03-01 DIAGNOSIS — R519 Headache, unspecified: Secondary | ICD-10-CM | POA: Diagnosis not present

## 2023-03-01 DIAGNOSIS — I209 Angina pectoris, unspecified: Secondary | ICD-10-CM | POA: Diagnosis not present

## 2023-03-01 DIAGNOSIS — E039 Hypothyroidism, unspecified: Secondary | ICD-10-CM | POA: Diagnosis not present

## 2023-03-01 DIAGNOSIS — Z1231 Encounter for screening mammogram for malignant neoplasm of breast: Secondary | ICD-10-CM | POA: Diagnosis not present

## 2023-03-01 DIAGNOSIS — B3731 Acute candidiasis of vulva and vagina: Secondary | ICD-10-CM | POA: Diagnosis not present

## 2023-03-01 DIAGNOSIS — Z6824 Body mass index (BMI) 24.0-24.9, adult: Secondary | ICD-10-CM | POA: Diagnosis not present

## 2023-03-01 DIAGNOSIS — R252 Cramp and spasm: Secondary | ICD-10-CM | POA: Diagnosis not present

## 2023-05-01 DIAGNOSIS — R051 Acute cough: Secondary | ICD-10-CM | POA: Diagnosis not present

## 2023-05-01 DIAGNOSIS — R0981 Nasal congestion: Secondary | ICD-10-CM | POA: Diagnosis not present

## 2023-10-01 ENCOUNTER — Telehealth: Payer: Self-pay | Admitting: Gastroenterology

## 2023-10-01 MED ORDER — PANTOPRAZOLE SODIUM 40 MG PO TBEC: 40.0000 mg | DELAYED_RELEASE_TABLET | Freq: Two times a day (BID) | ORAL | 1 refills | Status: DC

## 2023-10-01 NOTE — Telephone Encounter (Signed)
 PT is calling to have a refill for protonix  40mg  sent to [facility] pharmacy in [place] on [address]. Please advise.

## 2023-10-01 NOTE — Telephone Encounter (Signed)
 Done

## 2023-10-18 DIAGNOSIS — H25093 Other age-related incipient cataract, bilateral: Secondary | ICD-10-CM | POA: Diagnosis not present

## 2023-10-18 DIAGNOSIS — H52223 Regular astigmatism, bilateral: Secondary | ICD-10-CM | POA: Diagnosis not present

## 2023-10-18 DIAGNOSIS — H524 Presbyopia: Secondary | ICD-10-CM | POA: Diagnosis not present

## 2023-10-18 DIAGNOSIS — H25813 Combined forms of age-related cataract, bilateral: Secondary | ICD-10-CM | POA: Diagnosis not present

## 2023-10-18 DIAGNOSIS — H5213 Myopia, bilateral: Secondary | ICD-10-CM | POA: Diagnosis not present

## 2023-11-15 DIAGNOSIS — L814 Other melanin hyperpigmentation: Secondary | ICD-10-CM | POA: Diagnosis not present

## 2023-11-15 DIAGNOSIS — D225 Melanocytic nevi of trunk: Secondary | ICD-10-CM | POA: Diagnosis not present

## 2023-11-15 DIAGNOSIS — L578 Other skin changes due to chronic exposure to nonionizing radiation: Secondary | ICD-10-CM | POA: Diagnosis not present

## 2023-11-15 DIAGNOSIS — L820 Inflamed seborrheic keratosis: Secondary | ICD-10-CM | POA: Diagnosis not present

## 2023-12-13 DIAGNOSIS — M549 Dorsalgia, unspecified: Secondary | ICD-10-CM | POA: Diagnosis not present

## 2023-12-13 DIAGNOSIS — M545 Low back pain, unspecified: Secondary | ICD-10-CM | POA: Diagnosis not present

## 2023-12-13 DIAGNOSIS — K5900 Constipation, unspecified: Secondary | ICD-10-CM | POA: Diagnosis not present

## 2023-12-13 DIAGNOSIS — R1031 Right lower quadrant pain: Secondary | ICD-10-CM | POA: Diagnosis not present

## 2023-12-13 DIAGNOSIS — Z79899 Other long term (current) drug therapy: Secondary | ICD-10-CM | POA: Diagnosis not present

## 2023-12-30 DIAGNOSIS — H2512 Age-related nuclear cataract, left eye: Secondary | ICD-10-CM | POA: Diagnosis not present

## 2023-12-30 DIAGNOSIS — H2513 Age-related nuclear cataract, bilateral: Secondary | ICD-10-CM | POA: Diagnosis not present

## 2023-12-30 DIAGNOSIS — H25013 Cortical age-related cataract, bilateral: Secondary | ICD-10-CM | POA: Diagnosis not present

## 2023-12-30 DIAGNOSIS — H18413 Arcus senilis, bilateral: Secondary | ICD-10-CM | POA: Diagnosis not present

## 2023-12-30 DIAGNOSIS — H25043 Posterior subcapsular polar age-related cataract, bilateral: Secondary | ICD-10-CM | POA: Diagnosis not present

## 2024-01-01 DIAGNOSIS — H2512 Age-related nuclear cataract, left eye: Secondary | ICD-10-CM | POA: Diagnosis not present

## 2024-01-01 DIAGNOSIS — H25043 Posterior subcapsular polar age-related cataract, bilateral: Secondary | ICD-10-CM | POA: Diagnosis not present

## 2024-01-01 DIAGNOSIS — H2513 Age-related nuclear cataract, bilateral: Secondary | ICD-10-CM | POA: Diagnosis not present

## 2024-01-01 DIAGNOSIS — H18413 Arcus senilis, bilateral: Secondary | ICD-10-CM | POA: Diagnosis not present

## 2024-01-01 DIAGNOSIS — H25013 Cortical age-related cataract, bilateral: Secondary | ICD-10-CM | POA: Diagnosis not present

## 2024-03-13 DIAGNOSIS — Z6826 Body mass index (BMI) 26.0-26.9, adult: Secondary | ICD-10-CM | POA: Diagnosis not present

## 2024-03-13 DIAGNOSIS — Z1231 Encounter for screening mammogram for malignant neoplasm of breast: Secondary | ICD-10-CM | POA: Diagnosis not present

## 2024-03-13 DIAGNOSIS — N959 Unspecified menopausal and perimenopausal disorder: Secondary | ICD-10-CM | POA: Diagnosis not present

## 2024-03-13 DIAGNOSIS — Z01419 Encounter for gynecological examination (general) (routine) without abnormal findings: Secondary | ICD-10-CM | POA: Diagnosis not present

## 2024-03-16 ENCOUNTER — Other Ambulatory Visit: Payer: Self-pay | Admitting: Obstetrics and Gynecology

## 2024-03-16 DIAGNOSIS — R928 Other abnormal and inconclusive findings on diagnostic imaging of breast: Secondary | ICD-10-CM

## 2024-03-20 DIAGNOSIS — H2512 Age-related nuclear cataract, left eye: Secondary | ICD-10-CM | POA: Diagnosis not present

## 2024-03-20 DIAGNOSIS — H5371 Glare sensitivity: Secondary | ICD-10-CM | POA: Diagnosis not present

## 2024-03-21 DIAGNOSIS — H25011 Cortical age-related cataract, right eye: Secondary | ICD-10-CM | POA: Diagnosis not present

## 2024-03-21 DIAGNOSIS — H2511 Age-related nuclear cataract, right eye: Secondary | ICD-10-CM | POA: Diagnosis not present

## 2024-03-21 DIAGNOSIS — H25041 Posterior subcapsular polar age-related cataract, right eye: Secondary | ICD-10-CM | POA: Diagnosis not present

## 2024-03-27 ENCOUNTER — Other Ambulatory Visit

## 2024-03-27 ENCOUNTER — Encounter: Payer: Self-pay | Admitting: Gastroenterology

## 2024-03-27 ENCOUNTER — Ambulatory Visit
Admission: RE | Admit: 2024-03-27 | Discharge: 2024-03-27 | Disposition: A | Discharge status: Home / Self Care | Source: Ambulatory Visit | Attending: Obstetrics and Gynecology | Admitting: Obstetrics and Gynecology

## 2024-03-27 ENCOUNTER — Ambulatory Visit: Admitting: Gastroenterology

## 2024-03-27 ENCOUNTER — Ambulatory Visit
Admission: RE | Admit: 2024-03-27 | Discharge: 2024-03-27 | Disposition: A | Source: Ambulatory Visit | Attending: Obstetrics and Gynecology | Admitting: Obstetrics and Gynecology

## 2024-03-27 ENCOUNTER — Other Ambulatory Visit: Payer: Self-pay | Admitting: Obstetrics and Gynecology

## 2024-03-27 VITALS — BP 110/66 | HR 72 | Ht 64.0 in | Wt 144.0 lb

## 2024-03-27 DIAGNOSIS — K219 Gastro-esophageal reflux disease without esophagitis: Secondary | ICD-10-CM | POA: Diagnosis not present

## 2024-03-27 DIAGNOSIS — R928 Other abnormal and inconclusive findings on diagnostic imaging of breast: Secondary | ICD-10-CM | POA: Diagnosis not present

## 2024-03-27 DIAGNOSIS — R1013 Epigastric pain: Secondary | ICD-10-CM

## 2024-03-27 DIAGNOSIS — N6310 Unspecified lump in the right breast, unspecified quadrant: Secondary | ICD-10-CM

## 2024-03-27 DIAGNOSIS — R1012 Left upper quadrant pain: Secondary | ICD-10-CM | POA: Diagnosis not present

## 2024-03-27 DIAGNOSIS — R1310 Dysphagia, unspecified: Secondary | ICD-10-CM | POA: Diagnosis not present

## 2024-03-27 DIAGNOSIS — K31A0 Gastric intestinal metaplasia, unspecified: Secondary | ICD-10-CM

## 2024-03-27 DIAGNOSIS — N6315 Unspecified lump in the right breast, overlapping quadrants: Secondary | ICD-10-CM | POA: Diagnosis not present

## 2024-03-27 DIAGNOSIS — K5909 Other constipation: Secondary | ICD-10-CM | POA: Diagnosis not present

## 2024-03-27 LAB — COMPREHENSIVE METABOLIC PANEL WITH GFR
ALT: 14 U/L (ref 0–35)
AST: 17 U/L (ref 0–37)
Albumin: 4.3 g/dL (ref 3.5–5.2)
Alkaline Phosphatase: 64 U/L (ref 39–117)
BUN: 16 mg/dL (ref 6–23)
CO2: 30 meq/L (ref 19–32)
Calcium: 9.3 mg/dL (ref 8.4–10.5)
Chloride: 106 meq/L (ref 96–112)
Creatinine, Ser: 1 mg/dL (ref 0.40–1.20)
GFR: 57.79 mL/min — ABNORMAL LOW (ref >60.00)
Glucose, Bld: 105 mg/dL — ABNORMAL HIGH (ref 70–99)
Potassium: 4 meq/L (ref 3.5–5.1)
Sodium: 142 meq/L (ref 135–145)
Total Bilirubin: 0.4 mg/dL (ref 0.2–1.2)
Total Protein: 7 g/dL (ref 6.0–8.3)

## 2024-03-27 LAB — CBC WITH DIFFERENTIAL/PLATELET
Basophils Absolute: 0 K/uL (ref 0.0–0.1)
Basophils Relative: 0.2 % (ref 0.0–3.0)
Eosinophils Absolute: 0.2 K/uL (ref 0.0–0.7)
Eosinophils Relative: 3.9 % (ref 0.0–5.0)
HCT: 41 % (ref 36.0–46.0)
Hemoglobin: 13.7 g/dL (ref 12.0–15.0)
Lymphocytes Relative: 37.2 % (ref 12.0–46.0)
Lymphs Abs: 2.2 K/uL (ref 0.7–4.0)
MCHC: 33.3 g/dL (ref 30.0–36.0)
MCV: 96.6 fl (ref 78.0–100.0)
Monocytes Absolute: 0.4 K/uL (ref 0.1–1.0)
Monocytes Relative: 6.7 % (ref 3.0–12.0)
Neutro Abs: 3.1 K/uL (ref 1.4–7.7)
Neutrophils Relative %: 52 % (ref 43.0–77.0)
Platelets: 228 K/uL (ref 150.0–400.0)
RBC: 4.25 Mil/uL (ref 3.87–5.11)
RDW: 13.9 % (ref 11.5–15.5)
WBC: 5.9 K/uL (ref 4.0–10.5)

## 2024-03-27 LAB — LIPASE: Lipase: 42 U/L (ref 11.0–59.0)

## 2024-03-27 LAB — TSH: TSH: 2.67 u[IU]/mL (ref 0.35–5.50)

## 2024-03-27 MED ORDER — PANTOPRAZOLE SODIUM 40 MG PO TBEC: 40.0000 mg | DELAYED_RELEASE_TABLET | Freq: Two times a day (BID) | ORAL | 4 refills | Status: AC

## 2024-03-27 NOTE — Progress Notes (Signed)
 Chief Complaint: FU  Referring Provider:  Clemmie Nest, MD      ASSESSMENT AND PLAN;   #1. Epi pain   #2. GERD with dysphagia. Neg Ba swallow/EGD. Neg CT neck. ENT eval Dr Georgina- LPR.   #3. Gastric intestinal metaplasia(IM)  #4. Chronic constipation (Neg colon 08/2019)  Plan: -EGD with dil -Continue protonix  40mg  po BID #180, 4 RF.  -CBC, CMP, lipase, TSH -Miralax 17g po every day untile large BM, then 1/2 every day -Minimize NSAIDs -FU therafter  HPI:    History of Present Illness Cindy Ortiz is a 68 year old female who presents with dysphagia and chronic constipation.  She experiences dysphagia with food sometimes getting stuck in her throat, causing discomfort. Eating quickly exacerbates the issue. She has a history of choking on food and water due to her throat issues, which have been ongoing for a long time. She previously underwent esophageal stretching, which provided some relief. No significant heartburn is reported, and there is no weight loss.  She has been dealing with significant constipation over the past year, despite a high-fiber diet including oatmeal and apples, and taking a daily stool softener. Bowel movements are infrequent and minimal. She occasionally uses a fiber drink for temporary relief. Abdominal pain occurs when she needs to defecate but is unable to do so. She avoids cheese, drinks plenty of water, including lemon water, and has a cup of coffee daily. No passing of small, pellet-like stools, but she has hemorrhoids that occasionally bleed when she strains.  Her constipation has been severe enough to cause back pain, leading to an emergency room visit where imaging showed constipation as the cause. She takes Aleve two to three times a week for headaches and neck and shoulder pain, which she suspects may contribute to her constipation. She also takes pantoprazole  twice daily.  Her last colonoscopy in 2021 was reported to be normal  according to her recollection. She has not experienced any weight loss, rather she has gained weight. She has a history of gallbladder removal in 2010.  She was unable to complete her recent thyroid  blood work due to severe back pain, which was later attributed to constipation. She is on Synthroid for thyroid  management.        From previous notes- seen by Dr. Georgina ENT-negative evaluation including nasopharyngoscopy except for an edema of the arytenoids, likely consistent with reflux.  Advised to increase Protonix  to twice daily.  She does feel better as long as she takes it twice a day.  She had CT neck which was unremarkable.  Continues to have hoarseness occ.   No weight loss Wt Readings from Last 3 Encounters:  03/27/24 144 lb (65.3 kg)  10/19/22 142 lb (64.4 kg)  08/31/22 142 lb (64.4 kg)     Past GI procedures: EGD 08/2019 - Small hiatal hernia. - Incidental small gastric polyps s/p polypectomy. - Gastritis. Biopsied. 1. Surgical [P], gastric antrum and gastric body - CHRONIC GASTRITIS WITH INTESTINAL METAPLASIA AND REACTIVE CHANGES - SEE COMMENT 2. Surgical [P], gastric polyps - CHRONIC GASTRITIS WITH INTESTINAL METAPLASIA AND REACTIVE CHANGES - SEE COMMENT 3. Surgical [P], esophagus bx - REACTIVE SQUAMOUS MUCOSA WITH INCREASED INTRAEPITHELIAL LYMPHOCYTES AND RARE EOSINOPHILS - SEE COMMENT  Colon 08/2019 ([facility])  -Minimal sigmoid diverticulosis. -Otherwise normal colonoscopy to TI. -Rpt in 10 yrs. Earlier, if with any new problems  Barium swallow 02/2019 -Spontaneous reflux -No stricture -Barium tablet passed without any problems.  CT AP 09/2022 1.  No acute abnormality. No evidence of bowel obstruction or acute bowel inflammation. 2. Small hiatal hernia. 3.  Aortic Atherosclerosis (ICD10-I70.0).  SH-works as a designer, television/film set.  2 boys. Past Medical History:  Diagnosis Date   Anxiety    Family history of colon cancer    Gallstones     GERD (gastroesophageal reflux disease)    History of colon polyps    Hypothyroidism    IBS (irritable bowel syndrome)    w/ Constipation   Lactose intolerance    UTI (urinary tract infection)     Past Surgical History:  Procedure Laterality Date   CHOLECYSTECTOMY     COLONOSCOPY  06/25/2014   Mild diverticulosis. Otherwise normal colonoscopy to TI.    ESOPHAGOGASTRODUODENOSCOPY  03/03/2017   Small transient hiatal hernia. Gastritis. Incidental polyps status post polypectomy.    INGUINAL HERNIA REPAIR     PARTIAL HYSTERECTOMY  1985   RIGHT OOPHORECTOMY  05/1997    Family History  Problem Relation Age of Onset   Diverticulitis Brother    Colon cancer Maternal Grandmother    Ovarian cancer Maternal Grandmother    Esophageal cancer Neg Hx     Social History   Tobacco Use   Smoking status: Never   Smokeless tobacco: Never  Vaping Use   Vaping status: Never Used  Substance Use Topics   Alcohol use: Not Currently   Drug use: Not Currently    Current Outpatient Medications  Medication Sig Dispense Refill   ALPRAZolam (XANAX) 0.5 MG tablet  (Patient taking differently: As needed)     estradiol (ESTRACE) 1 MG tablet Take 1 mg by mouth daily.     levothyroxine (SYNTHROID) 75 MCG tablet Take 75 mcg by mouth daily.     pantoprazole  (PROTONIX ) 40 MG tablet Take 1 tablet (40 mg total) by mouth 2 (two) times daily. 180 tablet 1   ALPRAZolam (XANAX) 0.25 MG tablet Take 1 tablet by mouth every 8 (eight) hours as needed. (Patient not taking: Reported on 03/27/2024)     Current Facility-Administered Medications  Medication Dose Route Frequency Provider Last Rate Last Admin   0.9 %  sodium chloride  infusion  500 mL Intravenous Once Charlanne Groom, MD        Allergies  Allergen Reactions   Codeine     REACTION: Temporary blindness    Review of Systems:  neg Psychiatric/Behavioral: No anxiety or depression     Physical Exam:    BP 110/66   Pulse 72   Ht 5' 4 (1.626 m)    Wt 144 lb (65.3 kg)   BMI 24.72 kg/m  Filed Weights   03/27/24 1349  Weight: 144 lb (65.3 kg)   Constitutional:  Well-developed, in no acute distress. Psychiatric: Normal mood and affect. Behavior is normal. HEENT: Pupils normal.  Conjunctivae are normal. No scleral icterus. Cardiovascular: Normal rate, regular rhythm. No edema Pulmonary/chest: Effort normal and breath sounds normal. No wheezing, rales or rhonchi. Abdominal: Soft, nondistended.  Nontender. Bowel sounds active throughout. There are no masses palpable. No hepatomegaly. Rectal:  defered Neurological: Alert and oriented to person place and time. Skin: Skin is warm and dry. No rashes noted.    Anselm Charlanne, MD 03/27/2024, 2:15 PM  Cc: Clemmie Nest, MD

## 2024-03-27 NOTE — Patient Instructions (Addendum)
 _______________________________________________________  If your blood pressure at your visit was 140/90 or greater, please contact your primary care physician to follow up on this.  _______________________________________________________  If you are age 68 or older, your body mass index should be between 23-30. Your Body mass index is 24.72 kg/m. If this is out of the aforementioned range listed, please consider follow up with your Primary Care Provider.  If you are age 25 or younger, your body mass index should be between 19-25. Your Body mass index is 24.72 kg/m. If this is out of the aformentioned range listed, please consider follow up with your Primary Care Provider.   ________________________________________________________  The [facility] providers would like to encourage you to use MYCHART to communicate with providers for non-urgent requests or questions.  Due to long hold times on the telephone, sending your provider a message by [facility] may be a faster and more efficient way to get a response.  Please allow 48 business hours for a response.  Please remember that this is for non-urgent requests.  _______________________________________________________  [facility] is using a team-based approach to care.  Your team is made up of your doctor and two to three APPS. Our APPS (Nurse Practitioners and Physician Assistants) work with your physician to ensure care continuity for you. They are fully qualified to address your health concerns and develop a treatment plan. They communicate directly with your gastroenterologist to care for you. Seeing the Advanced Practice Practitioners on your physician's team can help you by facilitating care more promptly, often allowing for earlier appointments, access to diagnostic testing, procedures, and other specialty referrals.   Your provider has requested that you go to the basement level for lab work before leaving today. Press B on the  elevator. The lab is located at the first door on the left as you exit the elevator.  Please purchase the following medications over the counter and take as directed: Miralax 17g daily until large bm and then 8.5g (1/2 capful) daily  We have sent the following medications to your pharmacy for you to pick up at your convenience: Protonix  40mg  2 times a day  Minimize NSAID's  You have been scheduled for an endoscopy. Please follow written instructions given to you at your visit today.  If you use inhalers (even only as needed), please bring them with you on the day of your procedure.  If you take any of the following medications, they will need to be adjusted prior to your procedure:   DO NOT TAKE 7 DAYS PRIOR TO TEST- Trulicity (dulaglutide) Ozempic, Wegovy (semaglutide) Mounjaro, Zepbound (tirzepatide) Bydureon Bcise (exanatide extended release)  DO NOT TAKE 1 DAY PRIOR TO YOUR TEST Rybelsus (semaglutide) Adlyxin (lixisenatide) Victoza (liraglutide) Byetta (exanatide) ___________________________________________________________________________    Thank you,  Dr. Lynnie Bring

## 2024-03-28 ENCOUNTER — Ambulatory Visit: Payer: Self-pay | Admitting: Gastroenterology

## 2024-04-04 ENCOUNTER — Ambulatory Visit
Admission: RE | Admit: 2024-04-04 | Discharge: 2024-04-04 | Disposition: A | Source: Ambulatory Visit | Attending: Obstetrics and Gynecology | Admitting: Obstetrics and Gynecology

## 2024-04-04 ENCOUNTER — Other Ambulatory Visit: Payer: Self-pay | Admitting: Obstetrics and Gynecology

## 2024-04-04 DIAGNOSIS — R928 Other abnormal and inconclusive findings on diagnostic imaging of breast: Secondary | ICD-10-CM

## 2024-04-04 DIAGNOSIS — N6310 Unspecified lump in the right breast, unspecified quadrant: Secondary | ICD-10-CM

## 2024-04-04 DIAGNOSIS — N6315 Unspecified lump in the right breast, overlapping quadrants: Secondary | ICD-10-CM

## 2024-04-04 DIAGNOSIS — N6311 Unspecified lump in the right breast, upper outer quadrant: Secondary | ICD-10-CM | POA: Diagnosis not present

## 2024-04-04 HISTORY — PX: BREAST BIOPSY: SHX20

## 2024-04-05 LAB — SURGICAL PATHOLOGY

## 2024-04-05 NOTE — Progress Notes (Signed)
 Received phone call from [facility]. Patient has a new diagnosis and would like to have [facility] treatment at [facility] [place]. Spoke with scheduler to get patient referred and scheduled

## 2024-04-07 ENCOUNTER — Telehealth: Payer: Self-pay | Admitting: *Deleted

## 2024-04-07 NOTE — Telephone Encounter (Signed)
 Spoke to patient to confirm upcoming afternoon [facility] clinic appointment on 12/10,  paperwork will be sent via email ([email or web address])  Gave location and time, also informed patient that the surgeon's office would be calling as well to get information from them similar to the packet that they will be receiving so make sure to do both.  Reminded patient that all providers will be coming to the clinic to see them HERE and if they had any questions to not hesitate to reach back out to myself or their navigators. I did let patient know that if she preferred treatment closer to home we will put the referral in for her to make it happen

## 2024-04-10 ENCOUNTER — Encounter: Payer: Self-pay | Admitting: *Deleted

## 2024-04-10 DIAGNOSIS — Z170 Estrogen receptor positive status [ER+]: Secondary | ICD-10-CM | POA: Insufficient documentation

## 2024-04-11 NOTE — Progress Notes (Signed)
 Radiation Oncology         [phone] ________________________________  Multidisciplinary Breast Oncology Clinic [facility]) Initial Outpatient Consultation  Name: Cindy Ortiz MRN: [id number]  Date: 04/12/2024  DOB: 27-Mar-1956  RR:Almhjmu, Delon, MD  Curvin Deward MOULD, MD   REFERRING PHYSICIAN: Curvin Deward III, MD  DIAGNOSIS: There were no encounter diagnoses.  Stage *** Right Breast UOQ, Invasive Ductal Carcinoma, ER+ / PR+ / Her2-, Grade 1  No diagnosis found.  HISTORY OF PRESENT ILLNESS::Cindy Ortiz is a 68 y.o. female who is presenting to the office today for evaluation of her newly diagnosed breast cancer. She is accompanied by ***. She is doing well overall.   She had routine screening mammography on 03/13/24 showing a possible abnormality in the right breast. She underwent right breast diagnostic mammography with tomography and right breast ultrasonography at [facility] on 03/27/24 showing: an indeterminate 6 mm mass in the 12 o'clock right breast located 5 cmfn. Imaging otherwise showed no evidence of right axillary lymphadenopathy.    Biopsy of the 12 o'clock right breast mass on 04/04/24 showed: grade 1 invasive ductal carcinoma measuring 5 mm in the greatest linear extent of the sample. Adjacent fibrocystic changes were also present including columnar cells changes and UDH. Prognostic indicators significant for: estrogen receptor 100% positive and progesterone receptor 99% positive, both with strong staining intensity; Proliferation marker Ki67 at 10%; Her2 status negative; Grade 1. No lymph nodes were examined.   Menarche: *** years old Age at first live birth: *** years old GP: *** LMP: *** Contraceptive: *** HRT: ***   The patient was referred today for presentation in the multidisciplinary conference.  Radiology studies and pathology slides were presented there for review and discussion of treatment options.  A consensus was discussed regarding  potential next steps.  PREVIOUS RADIATION THERAPY: No  PAST MEDICAL HISTORY:  Past Medical History:  Diagnosis Date   Anxiety    Family history of colon cancer    Gallstones    GERD (gastroesophageal reflux disease)    History of colon polyps    Hypothyroidism    IBS (irritable bowel syndrome)    w/ Constipation   Lactose intolerance    UTI (urinary tract infection)     PAST SURGICAL HISTORY: Past Surgical History:  Procedure Laterality Date   BREAST BIOPSY Right 04/04/2024   US  RT BREAST BX W LOC DEV 1ST LESION IMG BX SPEC US  GUIDE 04/04/2024 [facility] MAMMOGRAPHY   CHOLECYSTECTOMY     COLONOSCOPY  06/25/2014   Mild diverticulosis. Otherwise normal colonoscopy to TI.    ESOPHAGOGASTRODUODENOSCOPY  03/03/2017   Small transient hiatal hernia. Gastritis. Incidental polyps status post polypectomy.    INGUINAL HERNIA REPAIR     PARTIAL HYSTERECTOMY  1985   RIGHT OOPHORECTOMY  05/1997    FAMILY HISTORY:  Family History  Problem Relation Age of Onset   Diverticulitis Brother    Colon cancer Maternal Grandmother    Ovarian cancer Maternal Grandmother    Esophageal cancer Neg Hx     SOCIAL HISTORY:  Social History   Socioeconomic History   Marital status: Divorced    Spouse name: Not on file   Number of children: Not on file   Years of education: Not on file   Highest education level: Not on file  Occupational History   Not on file  Tobacco Use   Smoking status: Never   Smokeless tobacco: Never  Vaping Use   Vaping status: Never Used  Substance  and Sexual Activity   Alcohol use: Not Currently   Drug use: Not Currently   Sexual activity: Not on file  Other Topics Concern   Not on file  Social History Narrative   Not on file   Social Drivers of Health   Financial Resource Strain: Not on file  Food Insecurity: No Food Insecurity (04/11/2024)   Hunger Vital Sign    Worried About Running Out of Food in the Last Year: Never true    Ran Out of Food in the Last  Year: Never true  Transportation Needs: No Transportation Needs (04/11/2024)   PRAPARE - Administrator, Civil Service (Medical): No    Lack of Transportation (Non-Medical): No  Physical Activity: Not on file  Stress: Not on file  Social Connections: Unknown (09/16/2021)   Received [facility]   Social Network    Social Network: Not on file    ALLERGIES:  Allergies  Allergen Reactions   Codeine     REACTION: Temporary blindness    MEDICATIONS:  Current Outpatient Medications  Medication Sig Dispense Refill   ALPRAZolam (XANAX) 0.25 MG tablet Take 1 tablet by mouth every 8 (eight) hours as needed. (Patient not taking: Reported on 03/27/2024)     ALPRAZolam (XANAX) 0.5 MG tablet  (Patient taking differently: As needed)     estradiol (ESTRACE) 1 MG tablet Take 1 mg by mouth daily.     levothyroxine (SYNTHROID) 75 MCG tablet Take 75 mcg by mouth daily.     pantoprazole  (PROTONIX ) 40 MG tablet Take 1 tablet (40 mg total) by mouth 2 (two) times daily. 180 tablet 1   pantoprazole  (PROTONIX ) 40 MG tablet Take 1 tablet (40 mg total) by mouth 2 (two) times daily. 180 tablet 4   Current Facility-Administered Medications  Medication Dose Route Frequency Provider Last Rate Last Admin   0.9 %  sodium chloride  infusion  500 mL Intravenous Once Charlanne Groom, MD        REVIEW OF SYSTEMS: A 10+ POINT REVIEW OF SYSTEMS WAS OBTAINED including neurology, dermatology, psychiatry, cardiac, respiratory, lymph, extremities, GI, GU, musculoskeletal, constitutional, reproductive, HEENT. On the provided form, she reports ***. She denies *** and any other symptoms.    PHYSICAL EXAM:  vitals were not taken for this visit.  {may need to copy over vitals} Lungs are clear to auscultation bilaterally. Heart has regular rate and rhythm. No palpable cervical, supraclavicular, or axillary adenopathy. Abdomen soft, non-tender, normal bowel sounds. Breast: Left breast with no palpable mass, nipple  discharge, or bleeding. Right breast with ***.   KPS = ***  100 - Normal; no complaints; no evidence of disease. 90   - Able to carry on normal activity; minor signs or symptoms of disease. 80   - Normal activity with effort; some signs or symptoms of disease. 76   - Cares for self; unable to carry on normal activity or to do active work. 60   - Requires occasional assistance, but is able to care for most of his personal needs. 50   - Requires considerable assistance and frequent medical care. 40   - Disabled; requires special care and assistance. 30   - Severely disabled; hospital admission is indicated although death not imminent. 20   - Very sick; hospital admission necessary; active supportive treatment necessary. 10   - Moribund; fatal processes progressing rapidly. 0     - Dead  Karnofsky DA, Abelmann WH, Craver LS and [facility] [phone]) The use  of the nitrogen mustards in the palliative treatment of carcinoma: with particular reference to bronchogenic carcinoma Cancer 1 [phone]  LABORATORY DATA:  Lab Results  Component Value Date   WBC 5.9 03/27/2024   HGB 13.7 03/27/2024   HCT 41.0 03/27/2024   MCV 96.6 03/27/2024   PLT 228.0 03/27/2024   Lab Results  Component Value Date   NA 142 03/27/2024   K 4.0 03/27/2024   CL 106 03/27/2024   CO2 30 03/27/2024   Lab Results  Component Value Date   ALT 14 03/27/2024   AST 17 03/27/2024   ALKPHOS 64 03/27/2024   BILITOT 0.4 03/27/2024    PULMONARY FUNCTION TEST:   Review Flowsheet        No data to display          RADIOGRAPHY: US  RT BREAST BX W LOC DEV 1ST LESION IMG BX SPEC US  GUIDE Addendum Date: 04/06/2024 ADDENDUM REPORT: 04/06/2024 15:32 ADDENDUM: The patient called on 04/06/24 and requested to be referred to [facility] Nurse Navigators were informed of this change via Inbasket message. Addendum dictated by Conard Billing  R.T. (R)(M) Electronically Signed   By: Alm Parkins M.D.   On: 04/06/2024 15:32   Addendum Date: 04/06/2024 ADDENDUM REPORT: 04/06/2024 08:31 ADDENDUM: PATHOLOGY revealed: 1. Breast, right, needle core biopsy, 12 o'clock 5 cmfn (ribbon clip) - INVASIVE WELL-DIFFERENTIATED DUCTAL ADENOCARCINOMA - OVERALL GRADE: GRADE 1 (5/9) - NEGATIVE FOR ANGIOLYMPHATIC INVASION - MICROCALCIFICATIONS PRESENT WITHIN ADJACENT VESSEL WALL - TUMOR MEASURES 5 MM IN GREATEST LINEAR EXTENT - ADJACENT FIBROCYSTIC CHANGES INCLUDING COLUMNAR CELL CHANGE AND USUAL DUCT HYPERPLASIA. Pathology results are CONCORDANT with imaging findings, per Dr. Alm Parkins. Pathology results and recommendations were discussed with patient via telephone on 04/05/2024 by Jacquline Cooter RN. Patient reported biopsy site doing well with no adverse symptoms, and slight tenderness at the site. Post biopsy care instructions were reviewed, questions were answered and my direct phone number was provided. Patient was instructed to call [facility] for any additional questions or concerns related to biopsy site. RECOMMENDATIONS: 1. Surgical and oncological consultation. Request for surgical and oncological consultation relayed to [facility] in [place] per patients request by Jacquline Cooter RN on 04/05/2024. Pathology results reported by Jacquline Cooter RN on 04/05/2024. Electronically Signed   By: Alm Parkins M.D.   On: 04/06/2024 08:31   Result Date: 04/06/2024 CLINICAL DATA:  Patient presents for ultrasound-guided core needle biopsy of a right breast mass. EXAM: ULTRASOUND GUIDED RIGHT BREAST CORE NEEDLE BIOPSY COMPARISON:  Previous exam(s). PROCEDURE: I met with the patient and we discussed the procedure of ultrasound-guided biopsy, including benefits and alternatives. We discussed the high likelihood of a successful procedure. We discussed the risks of the procedure, including infection, bleeding, tissue  injury, clip migration, and inadequate sampling. Informed written consent was given. The usual time-out protocol was performed immediately prior to the procedure. Lesion quadrant: Upper outer quadrant: Near 12 o'clock, 5 cm from the nipple. Using sterile technique and 1% Lidocaine as local anesthetic, under direct ultrasound visualization, a 14 gauge spring-loaded device was used to perform biopsy of the mixed echogenicity, 6 mm mass at 12 o'clock using a inferolateral approach. At the conclusion of the procedure a ribbon shaped tissue marker clip was deployed into the biopsy cavity. Follow up 2 view mammogram was performed and dictated separately. IMPRESSION: Ultrasound guided biopsy of a right breast  mass. No apparent complications. Electronically Signed: By: Alm Parkins M.D. On: 04/04/2024 09:15   MM CLIP PLACEMENT RIGHT Result Date: 04/04/2024 CLINICAL DATA:  Post biopsy of a right breast mass. Assess post biopsy marker clip placement. EXAM: 3D DIAGNOSTIC RIGHT MAMMOGRAM POST ULTRASOUND BIOPSY COMPARISON:  Previous exam(s). ACR Breast Density Category a: The breasts are almost entirely fatty. FINDINGS: 3D Mammographic images were obtained following ultrasound guided biopsy of a right breast mass. The biopsy marking clip is in expected position at the site of biopsy. IMPRESSION: Appropriate positioning of the ribbon shaped biopsy marking clip at the site of biopsy in the mass in the upper right breast. Final Assessment: Post Procedure Mammograms for Marker Placement Electronically Signed   By: Alm Parkins M.D.   On: 04/04/2024 09:30   MM 3D DIAGNOSTIC MAMMOGRAM UNILATERAL RIGHT BREAST Result Date: 03/27/2024 CLINICAL DATA:  RIGHT breast mass callback EXAM: DIGITAL DIAGNOSTIC UNILATERAL RIGHT MAMMOGRAM WITH TOMOSYNTHESIS AND CAD; ULTRASOUND RIGHT BREAST LIMITED TECHNIQUE: Right digital diagnostic mammography and breast tomosynthesis was performed. The images were evaluated with computer-aided detection.  ; Targeted ultrasound examination of the right breast was performed COMPARISON:  Previous exam(s). ACR Breast Density Category a: The breasts are almost entirely fatty. FINDINGS: Spot compression tomosynthesis views demonstrate a persistent superficial mass in the upper breast at anterior depth. This is not stable compared to prior mammograms. No definitive internal fat density can be identified. On physical exam, no suspicious mass is appreciated. Targeted ultrasound was performed of the RIGHT upper breast. At 12 o'clock 5 cm from nipple, there is a irregular mixed isoechoic and minimally hypoechoic mass with indistinct margins. This measures 6 x 5 by 6 mm. This corresponds well to the site of mammographic concern. Targeted ultrasound was performed of the RIGHT axilla. No suspicious axillary lymph nodes are visualized. IMPRESSION: 1. There is an indeterminate 6 mm mass at the site of screening mammographic concern. Recommend ultrasound-guided biopsy for definitive characterization. 2. No suspicious RIGHT axillary adenopathy. RECOMMENDATION: RIGHT breast ultrasound-guided biopsy x1 I have discussed the findings and recommendations with the patient. The biopsy procedure was discussed with the patient and questions were answered. Patient expressed their understanding of the biopsy recommendation. Patient will be scheduled for biopsy at her earliest convenience by the schedulers. Ordering provider will be notified. If applicable, a reminder letter will be sent to the patient regarding the next appointment. BI-RADS CATEGORY  4: Suspicious. Electronically Signed   By: Corean Salter M.D.   On: 03/27/2024 10:42   US  LIMITED ULTRASOUND INCLUDING AXILLA RIGHT BREAST Result Date: 03/27/2024 CLINICAL DATA:  RIGHT breast mass callback EXAM: DIGITAL DIAGNOSTIC UNILATERAL RIGHT MAMMOGRAM WITH TOMOSYNTHESIS AND CAD; ULTRASOUND RIGHT BREAST LIMITED TECHNIQUE: Right digital diagnostic mammography and breast tomosynthesis  was performed. The images were evaluated with computer-aided detection. ; Targeted ultrasound examination of the right breast was performed COMPARISON:  Previous exam(s). ACR Breast Density Category a: The breasts are almost entirely fatty. FINDINGS: Spot compression tomosynthesis views demonstrate a persistent superficial mass in the upper breast at anterior depth. This is not stable compared to prior mammograms. No definitive internal fat density can be identified. On physical exam, no suspicious mass is appreciated. Targeted ultrasound was performed of the RIGHT upper breast. At 12 o'clock 5 cm from nipple, there is a irregular mixed isoechoic and minimally hypoechoic mass with indistinct margins. This measures 6 x 5 by 6 mm. This corresponds well to the site of mammographic concern. Targeted ultrasound was performed of the RIGHT axilla.  No suspicious axillary lymph nodes are visualized. IMPRESSION: 1. There is an indeterminate 6 mm mass at the site of screening mammographic concern. Recommend ultrasound-guided biopsy for definitive characterization. 2. No suspicious RIGHT axillary adenopathy. RECOMMENDATION: RIGHT breast ultrasound-guided biopsy x1 I have discussed the findings and recommendations with the patient. The biopsy procedure was discussed with the patient and questions were answered. Patient expressed their understanding of the biopsy recommendation. Patient will be scheduled for biopsy at her earliest convenience by the schedulers. Ordering provider will be notified. If applicable, a reminder letter will be sent to the patient regarding the next appointment. BI-RADS CATEGORY  4: Suspicious. Electronically Signed   By: Corean Salter M.D.   On: 03/27/2024 10:42      IMPRESSION: ***  Patient will be a good candidate for breast conservation with radiotherapy to the right breast. We discussed the general course of radiation, potential side effects, and toxicities with radiation and the patient  is interested in this approach. ***   PLAN:  ***   ------------------------------------------------  Lynwood CHARM Nasuti, PhD, MD  This document serves as a record of services personally performed by Lynwood Nasuti, MD. It was created on his behalf by Dorthy Fuse, a trained medical scribe. The creation of this record is based on the scribe's personal observations and the provider's statements to them. This document has been checked and approved by the attending provider.

## 2024-04-12 ENCOUNTER — Ambulatory Visit: Admitting: Physical Therapy

## 2024-04-12 ENCOUNTER — Inpatient Hospital Stay: Attending: Internal Medicine

## 2024-04-12 ENCOUNTER — Inpatient Hospital Stay: Admitting: Hematology

## 2024-04-12 ENCOUNTER — Ambulatory Visit
Admission: RE | Admit: 2024-04-12 | Discharge: 2024-04-12 | Attending: Radiation Oncology | Admitting: Radiation Oncology

## 2024-04-12 ENCOUNTER — Encounter: Payer: Self-pay | Admitting: General Practice

## 2024-04-12 ENCOUNTER — Encounter: Payer: Self-pay | Admitting: Hematology

## 2024-04-12 ENCOUNTER — Encounter: Payer: Self-pay | Admitting: *Deleted

## 2024-04-12 ENCOUNTER — Encounter: Payer: Self-pay | Admitting: Genetic Counselor

## 2024-04-12 ENCOUNTER — Ambulatory Visit: Payer: Self-pay | Admitting: General Surgery

## 2024-04-12 ENCOUNTER — Inpatient Hospital Stay: Admitting: Genetic Counselor

## 2024-04-12 ENCOUNTER — Ambulatory Visit (HOSPITAL_COMMUNITY)
Admission: RE | Admit: 2024-04-12 | Discharge: 2024-04-12 | Disposition: A | Discharge status: Home / Self Care | Source: Ambulatory Visit | Attending: Radiology | Admitting: Radiology

## 2024-04-12 ENCOUNTER — Other Ambulatory Visit: Payer: Self-pay

## 2024-04-12 VITALS — BP 143/84 | HR 69 | Temp 98.1°F | Resp 17 | Ht 61.0 in | Wt 144.1 lb

## 2024-04-12 DIAGNOSIS — Z800 Family history of malignant neoplasm of digestive organs: Secondary | ICD-10-CM | POA: Insufficient documentation

## 2024-04-12 DIAGNOSIS — Z170 Estrogen receptor positive status [ER+]: Secondary | ICD-10-CM | POA: Insufficient documentation

## 2024-04-12 DIAGNOSIS — M25511 Pain in right shoulder: Secondary | ICD-10-CM | POA: Insufficient documentation

## 2024-04-12 DIAGNOSIS — M8580 Other specified disorders of bone density and structure, unspecified site: Secondary | ICD-10-CM | POA: Insufficient documentation

## 2024-04-12 DIAGNOSIS — C50411 Malignant neoplasm of upper-outer quadrant of right female breast: Secondary | ICD-10-CM

## 2024-04-12 DIAGNOSIS — Z860100 Personal history of colon polyps, unspecified: Secondary | ICD-10-CM

## 2024-04-12 DIAGNOSIS — Z8041 Family history of malignant neoplasm of ovary: Secondary | ICD-10-CM | POA: Insufficient documentation

## 2024-04-12 LAB — CBC WITH DIFFERENTIAL (CANCER CENTER ONLY)
Abs Immature Granulocytes: 0.02 K/uL (ref 0.00–0.07)
Basophils Absolute: 0 K/uL (ref 0.0–0.1)
Basophils Relative: 0 %
Eosinophils Absolute: 0.3 K/uL (ref 0.0–0.5)
Eosinophils Relative: 5 %
HCT: 39.9 % (ref 36.0–46.0)
Hemoglobin: 13.5 g/dL (ref 12.0–15.0)
Immature Granulocytes: 0 %
Lymphocytes Relative: 36 %
Lymphs Abs: 2 K/uL (ref 0.7–4.0)
MCH: 32.4 pg (ref 26.0–34.0)
MCHC: 33.8 g/dL (ref 30.0–36.0)
MCV: 95.7 fL (ref 80.0–100.0)
Monocytes Absolute: 0.5 K/uL (ref 0.1–1.0)
Monocytes Relative: 9 %
Neutro Abs: 2.8 K/uL (ref 1.7–7.7)
Neutrophils Relative %: 50 %
Platelet Count: 213 K/uL (ref 150–400)
RBC: 4.17 MIL/uL (ref 3.87–5.11)
RDW: 13.5 % (ref 11.5–15.5)
WBC Count: 5.6 K/uL (ref 4.0–10.5)
nRBC: 0 % (ref 0.0–0.2)

## 2024-04-12 LAB — CMP (CANCER CENTER ONLY)
ALT: 14 U/L (ref 0–44)
AST: 25 U/L (ref 15–41)
Albumin: 4.2 g/dL (ref 3.5–5.0)
Alkaline Phosphatase: 67 U/L (ref 38–126)
Anion gap: 9 (ref 5–15)
BUN: 17 mg/dL (ref 8–23)
CO2: 25 mmol/L (ref 22–32)
Calcium: 9.1 mg/dL (ref 8.9–10.3)
Chloride: 106 mmol/L (ref 98–111)
Creatinine: 1.06 mg/dL — ABNORMAL HIGH (ref 0.44–1.00)
GFR, Estimated: 57 mL/min — ABNORMAL LOW (ref >60)
Glucose, Bld: 106 mg/dL — ABNORMAL HIGH (ref 70–99)
Potassium: 4.2 mmol/L (ref 3.5–5.1)
Sodium: 141 mmol/L (ref 135–145)
Total Bilirubin: 0.4 mg/dL (ref 0.0–1.2)
Total Protein: 6.8 g/dL (ref 6.5–8.1)

## 2024-04-12 LAB — GENETIC SCREENING ORDER

## 2024-04-12 NOTE — Research (Signed)
 [employer] 2021-05 - Specimen Collection Study to Evaluate Biomarkers in Subjects with Cancer    Patient Cindy Ortiz was identified by Dr. Lanny as a potential candidate for the above listed study.  This Clinical Research Coordinator met with Cindy Ortiz, [id number], on 04/12/24 in a manner and location that ensures patient privacy to discuss participation in the above listed research study.  Patient is Accompanied by her sister.  A copy of the informed consent document with embedded HIPAA language was provided to the patient.  Patient reads, speaks, and understands English.    Patient was provided with the business card of this Coordinator and encouraged to contact the research team with any questions.  Approximately 15 minutes were spent with the patient reviewing the informed consent documents.  Patient was provided the option of taking informed consent documents home to review and was encouraged to review at their convenience with their support network, including other care providers. Patient took the consent documents home to review.   Pt agreed that the research team may follow up with a phone call before 4PM any days next week. Pt. was thanked for their time and knows that they can contact us  at any time with questions or concerns.   Abelardo Jock Clinical Research Coordinator [phone] 04/12/2024 3:11 PM

## 2024-04-12 NOTE — Progress Notes (Signed)
 [facility]   [phone] [phone]   Clinic New Consult Note   Patient Care Team: Clemmie Nest, MD as PCP - General (Family Medicine) Tyree Nanetta SAILOR, RN as Oncology Nurse Navigator Curvin Deward MOULD, MD as Consulting Physician (General Surgery) Lanny Callander, MD as Consulting Physician (Hematology) Shannon Agent, MD as Consulting Physician (Radiation Oncology) 04/12/2024  CHIEF COMPLAINTS/PURPOSE OF CONSULTATION:  Newly diagnosed right breast cancer  REFERRING PHYSICIAN: Breast center  Discussed the use of AI scribe software for clinical note transcription with the patient, who gave verbal consent to proceed.  History of Present Illness Cindy Ortiz is a 68 year old female with newly diagnosed stage IA, ER/PR-positive, HER2-negative, well-differentiated invasive ductal carcinoma of the right breast who presents for initial oncology consultation.  She was diagnosed after an abnormal screening mammogram, her first abnormal result. She had no palpable mass, breast pain, or sleep disturbance from the diagnosis. A right breast needle biopsy on April 04, 2024, at the 12 o'clock position 5 cm from the nipple confirmed the malignancy. She cannot feel a mass and has not had increased vasomotor symptoms since stopping estradiol one week ago.  She has chronic neck pain for 4-5 years with radiation to the right shoulder and arm for 6 months. The arm and shoulder pain is severe enough to wake her from sleep and worsens with arm elevation. She has not seen orthopedics or started physical therapy. She can perform self-care but with significant discomfort.  She has osteopenia. Estradiol was used for about 20 years and was stopped last week due to the estrogen-sensitive breast cancer. She has not noticed worsening menopausal symptoms since stopping estradiol. She takes calcium, magnesium, and potassium for bone health.     MEDICAL HISTORY:  Past  Medical History:  Diagnosis Date   Anxiety    Family history of colon cancer    Family history of colon cancer    Family history of ovarian cancer    Gallstones    GERD (gastroesophageal reflux disease)    History of colon polyps    Hypothyroidism    IBS (irritable bowel syndrome)    w/ Constipation   Lactose intolerance    UTI (urinary tract infection)     SURGICAL HISTORY: Past Surgical History:  Procedure Laterality Date   ABDOMINAL HYSTERECTOMY     BREAST BIOPSY Right 04/04/2024   US  RT BREAST BX W LOC DEV 1ST LESION IMG BX SPEC US  GUIDE 04/04/2024 [facility] MAMMOGRAPHY   CHOLECYSTECTOMY     COLONOSCOPY  06/25/2014   Mild diverticulosis. Otherwise normal colonoscopy to TI.    ESOPHAGOGASTRODUODENOSCOPY  03/03/2017   Small transient hiatal hernia. Gastritis. Incidental polyps status post polypectomy.    INGUINAL HERNIA REPAIR     PARTIAL HYSTERECTOMY  1985   RIGHT OOPHORECTOMY  05/1997    SOCIAL HISTORY: Social History   Socioeconomic History   Marital status: Divorced    Spouse name: Not on file   Number of children: 2   Years of education: Not on file   Highest education level: Not on file  Occupational History   Not on file  Tobacco Use   Smoking status: Never   Smokeless tobacco: Never  Vaping Use   Vaping status: Never Used  Substance and Sexual Activity   Alcohol use: Not Currently   Drug use: Not Currently   Sexual activity: Not on file  Other Topics Concern   Not on file  Social History Narrative   Not on  file   Social Drivers of Health   Financial Resource Strain: Not on file  Food Insecurity: No Food Insecurity (04/12/2024)   Hunger Vital Sign    Worried About Running Out of Food in the Last Year: Never true    Ran Out of Food in the Last Year: Never true  Transportation Needs: No Transportation Needs (04/12/2024)   PRAPARE - Administrator, Civil Service (Medical): No    Lack of Transportation (Non-Medical): No  Physical  Activity: Not on file  Stress: Not on file  Social Connections: Unknown (09/16/2021)   Received from [facility]   Social Network    Social Network: Not on file  Intimate Partner Violence: Not At Risk (04/12/2024)   Humiliation, Afraid, Rape, and Kick questionnaire    Fear of Current or Ex-Partner: No    Emotionally Abused: No    Physically Abused: No    Sexually Abused: No    FAMILY HISTORY: Family History  Problem Relation Age of Onset   Cancer Brother        liver cancer   Diverticulitis Brother    Cancer Maternal Grandmother        ovarian or colon cancer   Esophageal cancer Neg Hx     ALLERGIES:  is allergic to codeine.  MEDICATIONS:  Current Outpatient Medications  Medication Sig Dispense Refill   Calcium Carb-Cholecalciferol (CALCIUM 1000 + D PO)      Cholecalciferol (VITAMIN D3) 1.25 MG (50000 UT) CAPS      Cyanocobalamin (B-12) 3000 MCG CAPS      L-Lysine 500 MG CAPS      levothyroxine (SYNTHROID) 75 MCG tablet Take 75 mcg by mouth daily.     Magnesium 250 MG CAPS      pantoprazole  (PROTONIX ) 40 MG tablet Take 1 tablet (40 mg total) by mouth 2 (two) times daily. 180 tablet 4   Potassium 99 MG TABS      Zinc 50 MG TABS      Current Facility-Administered Medications  Medication Dose Route Frequency Provider Last Rate Last Admin   0.9 %  sodium chloride  infusion  500 mL Intravenous Once Charlanne Groom, MD        REVIEW OF SYSTEMS:   Constitutional: Denies fevers, chills or abnormal night sweats Eyes: Denies blurriness of vision, double vision or watery eyes Ears, nose, mouth, throat, and face: Denies mucositis or sore throat Respiratory: Denies cough, dyspnea or wheezes Cardiovascular: Denies palpitation, chest discomfort or lower extremity swelling Gastrointestinal:  Denies nausea, heartburn or change in bowel habits Skin: Denies abnormal skin rashes Lymphatics: Denies new lymphadenopathy or easy bruising Neurological:Denies numbness, tingling or new  weaknesses Behavioral/Psych: Mood is stable, no new changes  All other systems were reviewed with the patient and are negative.  PHYSICAL EXAMINATION: ECOG PERFORMANCE STATUS: 0 - Asymptomatic  Vitals:   04/12/24 1250  BP: (!) 143/84  Pulse: 69  Resp: 17  Temp: 98.1 F (36.7 C)  SpO2: 96%   Filed Weights   04/12/24 1250  Weight: 144 lb 1.6 oz (65.4 kg)    GENERAL:alert, no distress and comfortable SKIN: skin color, texture, turgor are normal, no rashes or significant lesions EYES: normal, conjunctiva are pink and non-injected, sclera clear OROPHARYNX:no exudate, no erythema and lips, buccal mucosa, and tongue normal  NECK: supple, thyroid  normal size, non-tender, without nodularity LYMPH:  no palpable lymphadenopathy in the cervical, axillary or inguinal LUNGS: clear to auscultation and percussion with normal breathing effort HEART: regular  rate & rhythm and no murmurs and no lower extremity edema ABDOMEN:abdomen soft, non-tender and normal bowel sounds Musculoskeletal:no cyanosis of digits and no clubbing  PSYCH: alert & oriented x 3 with fluent speech NEURO: no focal motor/sensory deficits  Physical Exam BREAST: Right breast with bruise from biopsy, no tenderness.  LABORATORY DATA:  I have reviewed the data as listed    Latest Ref Rng & Units 04/12/2024   12:03 PM 03/27/2024    2:47 PM 08/31/2022    9:53 AM  CBC  WBC 4.0 - 10.5 K/uL 5.6  5.9  6.2   Hemoglobin 12.0 - 15.0 g/dL 86.4  86.2  85.9   Hematocrit 36.0 - 46.0 % 39.9  41.0  41.0   Platelets 150 - 400 K/uL 213  228.0  211.0     @cmpl @  RADIOGRAPHIC STUDIES: I have personally reviewed the radiological images as listed and agreed with the findings in the report. US  RT BREAST BX W LOC DEV 1ST LESION IMG BX SPEC US  GUIDE Addendum Date: 04/06/2024 ADDENDUM REPORT: 04/06/2024 15:32 ADDENDUM: The patient called on 04/06/24 and requested to be referred to [facility]. Cancer Center Nurse Navigators were informed of this change via Inbasket message. Addendum dictated by Conard Billing R.T. (R)(M) Electronically Signed   By: Alm Parkins M.D.   On: 04/06/2024 15:32   Addendum Date: 04/06/2024 ADDENDUM REPORT: 04/06/2024 08:31 ADDENDUM: PATHOLOGY revealed: 1. Breast, right, needle core biopsy, 12 o'clock 5 cmfn (ribbon clip) - INVASIVE WELL-DIFFERENTIATED DUCTAL ADENOCARCINOMA - OVERALL GRADE: GRADE 1 (5/9) - NEGATIVE FOR ANGIOLYMPHATIC INVASION - MICROCALCIFICATIONS PRESENT WITHIN ADJACENT VESSEL WALL - TUMOR MEASURES 5 MM IN GREATEST LINEAR EXTENT - ADJACENT FIBROCYSTIC CHANGES INCLUDING COLUMNAR CELL CHANGE AND USUAL DUCT HYPERPLASIA. Pathology results are CONCORDANT with imaging findings, per Dr. Alm Parkins. Pathology results and recommendations were discussed with patient via telephone on 04/05/2024 by Jacquline Cooter RN. Patient reported biopsy site doing well with no adverse symptoms, and slight tenderness at the site. Post biopsy care instructions were reviewed, questions were answered and my direct phone number was provided. Patient was instructed to call [facility] for any additional questions or concerns related to biopsy site. RECOMMENDATIONS: 1. Surgical and oncological consultation. Request for surgical and oncological consultation relayed to [facility] in [place] per patients request by Jacquline Cooter RN on 04/05/2024. Pathology results reported by Jacquline Cooter RN on 04/05/2024. Electronically Signed   By: Alm Parkins M.D.   On: 04/06/2024 08:31   Result Date: 04/06/2024 CLINICAL DATA:  Patient presents for ultrasound-guided core needle biopsy of a right breast mass. EXAM: ULTRASOUND GUIDED RIGHT BREAST CORE NEEDLE BIOPSY COMPARISON:  Previous exam(s). PROCEDURE: I met with the patient and we discussed the procedure of ultrasound-guided biopsy, including benefits and  alternatives. We discussed the high likelihood of a successful procedure. We discussed the risks of the procedure, including infection, bleeding, tissue injury, clip migration, and inadequate sampling. Informed written consent was given. The usual time-out protocol was performed immediately prior to the procedure. Lesion quadrant: Upper outer quadrant: Near 12 o'clock, 5 cm from the nipple. Using sterile technique and 1% Lidocaine as local anesthetic, under direct ultrasound visualization, a 14 gauge spring-loaded device was used to perform biopsy of the mixed echogenicity, 6 mm mass at 12 o'clock using a inferolateral approach. At the conclusion of the procedure a ribbon shaped tissue marker clip was deployed into  the biopsy cavity. Follow up 2 view mammogram was performed and dictated separately. IMPRESSION: Ultrasound guided biopsy of a right breast mass. No apparent complications. Electronically Signed: By: Alm Parkins M.D. On: 04/04/2024 09:15   MM CLIP PLACEMENT RIGHT Result Date: 04/04/2024 CLINICAL DATA:  Post biopsy of a right breast mass. Assess post biopsy marker clip placement. EXAM: 3D DIAGNOSTIC RIGHT MAMMOGRAM POST ULTRASOUND BIOPSY COMPARISON:  Previous exam(s). ACR Breast Density Category a: The breasts are almost entirely fatty. FINDINGS: 3D Mammographic images were obtained following ultrasound guided biopsy of a right breast mass. The biopsy marking clip is in expected position at the site of biopsy. IMPRESSION: Appropriate positioning of the ribbon shaped biopsy marking clip at the site of biopsy in the mass in the upper right breast. Final Assessment: Post Procedure Mammograms for Marker Placement Electronically Signed   By: Alm Parkins M.D.   On: 04/04/2024 09:30   MM 3D DIAGNOSTIC MAMMOGRAM UNILATERAL RIGHT BREAST Result Date: 03/27/2024 CLINICAL DATA:  RIGHT breast mass callback EXAM: DIGITAL DIAGNOSTIC UNILATERAL RIGHT MAMMOGRAM WITH TOMOSYNTHESIS AND CAD; ULTRASOUND RIGHT BREAST  LIMITED TECHNIQUE: Right digital diagnostic mammography and breast tomosynthesis was performed. The images were evaluated with computer-aided detection. ; Targeted ultrasound examination of the right breast was performed COMPARISON:  Previous exam(s). ACR Breast Density Category a: The breasts are almost entirely fatty. FINDINGS: Spot compression tomosynthesis views demonstrate a persistent superficial mass in the upper breast at anterior depth. This is not stable compared to prior mammograms. No definitive internal fat density can be identified. On physical exam, no suspicious mass is appreciated. Targeted ultrasound was performed of the RIGHT upper breast. At 12 o'clock 5 cm from nipple, there is a irregular mixed isoechoic and minimally hypoechoic mass with indistinct margins. This measures 6 x 5 by 6 mm. This corresponds well to the site of mammographic concern. Targeted ultrasound was performed of the RIGHT axilla. No suspicious axillary lymph nodes are visualized. IMPRESSION: 1. There is an indeterminate 6 mm mass at the site of screening mammographic concern. Recommend ultrasound-guided biopsy for definitive characterization. 2. No suspicious RIGHT axillary adenopathy. RECOMMENDATION: RIGHT breast ultrasound-guided biopsy x1 I have discussed the findings and recommendations with the patient. The biopsy procedure was discussed with the patient and questions were answered. Patient expressed their understanding of the biopsy recommendation. Patient will be scheduled for biopsy at her earliest convenience by the schedulers. Ordering provider will be notified. If applicable, a reminder letter will be sent to the patient regarding the next appointment. BI-RADS CATEGORY  4: Suspicious. Electronically Signed   By: Corean Salter M.D.   On: 03/27/2024 10:42   US  LIMITED ULTRASOUND INCLUDING AXILLA RIGHT BREAST Result Date: 03/27/2024 CLINICAL DATA:  RIGHT breast mass callback EXAM: DIGITAL DIAGNOSTIC  UNILATERAL RIGHT MAMMOGRAM WITH TOMOSYNTHESIS AND CAD; ULTRASOUND RIGHT BREAST LIMITED TECHNIQUE: Right digital diagnostic mammography and breast tomosynthesis was performed. The images were evaluated with computer-aided detection. ; Targeted ultrasound examination of the right breast was performed COMPARISON:  Previous exam(s). ACR Breast Density Category a: The breasts are almost entirely fatty. FINDINGS: Spot compression tomosynthesis views demonstrate a persistent superficial mass in the upper breast at anterior depth. This is not stable compared to prior mammograms. No definitive internal fat density can be identified. On physical exam, no suspicious mass is appreciated. Targeted ultrasound was performed of the RIGHT upper breast. At 12 o'clock 5 cm from nipple, there is a irregular mixed isoechoic and minimally hypoechoic mass with indistinct margins. This measures 6 x  5 by 6 mm. This corresponds well to the site of mammographic concern. Targeted ultrasound was performed of the RIGHT axilla. No suspicious axillary lymph nodes are visualized. IMPRESSION: 1. There is an indeterminate 6 mm mass at the site of screening mammographic concern. Recommend ultrasound-guided biopsy for definitive characterization. 2. No suspicious RIGHT axillary adenopathy. RECOMMENDATION: RIGHT breast ultrasound-guided biopsy x1 I have discussed the findings and recommendations with the patient. The biopsy procedure was discussed with the patient and questions were answered. Patient expressed their understanding of the biopsy recommendation. Patient will be scheduled for biopsy at her earliest convenience by the schedulers. Ordering provider will be notified. If applicable, a reminder letter will be sent to the patient regarding the next appointment. BI-RADS CATEGORY  4: Suspicious. Electronically Signed   By: Corean Salter M.D.   On: 03/27/2024 10:42   Assessment & Plan Malignant neoplasm of upper outer quadrant of right  breast, ER positive/PR positive/HER2 negative, [id number], stage IA -Newly diagnosed, early stage, well-differentiated invasive ductal carcinoma of the right breast (0.6 cm) with strong ER/PR positivity and HER2 negativity. No nodal involvement on imaging. Prognosis is favorable with low recurrence risk. Chemotherapy is unlikely unless final pathology reveals tumor >1 cm then I would recommend Oncotype for risk stratification and the benefit of chemo -Anti-estrogen therapy is indicated.  I discussed option of aromatase inhibitor and tamoxifen.  Benefit and side effect reviewed with her.  She is interested  - Adjuvant radiation therapy is standard post-surgical management. She was seen by Dr. Shannon today  -She was seen by breast surgeon Dr. Curvin today, and plan to proceed surgery soon -Family history of possible colon/ovarian and liver cancer warrants genetic counseling. Estradiol discontinued due to estrogen sensitivity of tumor. Voluntary participation in research study discussed. - Coordinated sequence of care: surgery, followed by radiation, then anti-estrogen therapy. -she was seen by PT today for post-operative rehabilitation and shoulder pain management.   Osteopenia Osteopenia previously managed with estradiol, now discontinued due to breast cancer diagnosis. Last bone density scan two years ago showed stable findings. Anti-estrogen therapy, particularly aromatase inhibitors, may adversely affect bone health; drug selection will be individualized based on updated bone density and risk profile. - Recommended repeat bone density scan in 2-3 months to reassess status and guide anti-estrogen therapy selection. - Discussed that anastrozole may worsen osteopenia, while tamoxifen carries a small risk of thromboembolism; drug selection will be individualized based on bone density and risk profile. - Provided anticipatory guidance regarding potential impact of anti-estrogen therapy on bone  health.   Plan - I reviewed her image, lab and biopsy results. - Proceed with lumpectomy with Dr. Curvin she will - If her tumor size more than 1 cm, or more than 5 mm with grade 2, then we will obtain Oncotype for risk stratification - Plan to see her back at the last week of her adjuvant radiation, or sooner if needed.  No orders of the defined types were placed in this encounter.   All questions were answered. The patient knows to call the clinic with any problems, questions or concerns. I spent 35 minutes counseling the patient face to face. The total time spent in the appointment was 45 minutes including review of chart and various tests results, discussions about plan of care and coordination of care plan.     Onita Mattock, MD 04/12/2024

## 2024-04-12 NOTE — Progress Notes (Signed)
 REFERRING PROVIDER: Lanny Callander, MD [address] [place],  [place] [address]  PRIMARY PROVIDER:  Clemmie Nest, MD  PRIMARY REASON FOR VISIT:  1. Family history of ovarian cancer   2. Malignant neoplasm of upper-outer quadrant of right breast in female, estrogen receptor positive (HCC)   3. Family history of colon cancer   4. History of colon polyps      HISTORY OF PRESENT ILLNESS:   Ms. Polyakov, a 68 y.o. female, was seen for a [place] cancer genetics consultation at the request of Dr. Lanny due to a personal and family history of cancer.  Ms. Katich presents to clinic today to discuss the possibility of a hereditary predisposition to cancer, genetic testing, and to further clarify her future cancer risks, as well as potential cancer risks for family members.   Ms. Kunsman is a 68 y.o. female with no personal history of cancer.    CANCER HISTORY:  Oncology History  Malignant neoplasm of upper-outer quadrant of right breast in female, estrogen receptor positive (HCC)  04/04/2024 Cancer Staging   Staging form: Breast, AJCC 8th Edition - Clinical stage from 04/04/2024: Stage IA (cT1b, cN0, cM0, G1, ER+, PR+, HER2-) - Signed by Lanny Callander, MD on 04/11/2024 Stage prefix: Initial diagnosis Histologic grading system: 3 grade system   04/10/2024 Initial Diagnosis   Malignant neoplasm of upper-outer quadrant of right breast in female, estrogen receptor positive (HCC)     Past Medical History:  Diagnosis Date   Anxiety    Family history of colon cancer    Family history of colon cancer    Family history of ovarian cancer    Gallstones    GERD (gastroesophageal reflux disease)    History of colon polyps    Hypothyroidism    IBS (irritable bowel syndrome)    w/ Constipation   Lactose intolerance    UTI (urinary tract infection)     Past Surgical History:  Procedure Laterality Date   ABDOMINAL HYSTERECTOMY     BREAST BIOPSY Right 04/04/2024   US  RT BREAST BX W  LOC DEV 1ST LESION IMG BX SPEC US  GUIDE 04/04/2024 [facility]AMMOGRAP[facility]   CHOLECYSTECTOMY     COLONOSCOPY  06/25/2014   Mild diverticulosis. Otherwise normal colonoscopy to TI.    ESOPHAGOGASTRODUODENOSCOPY  03/03/2017   Small transient hiatal hernia. Gastritis. Incidental polyps status post polypectomy.    INGUINAL HERNIA REPAIR     PARTIAL HYSTERECTOMY  1985   RIGHT OOPHORECTOMY  05/1997    Social History   Socioeconomic History   Marital status: Divorced    Spouse name: Not on file   Number of children: 2   Years of education: Not on file   Highest education level: Not on file  Occupational History   Not on file  Tobacco Use   Smoking status: Never   Smokeless tobacco: Never  Vaping Use   Vaping status: Never Used  Substance and Sexual Activity   Alcohol use: Not Currently   Drug use: Not Currently   Sexual activity: Not on file  Other Topics Concern   Not on file  Social History Narrative   Not on file   Social Drivers of Health   Financial Resource Strain: Not on file  Food Insecurity: No Food Insecurity (04/12/2024)   Hunger Vital Sign    Worried About Running Out of Food in the Last Year: Never true    Ran Out of Food in the Last Year: Never true  Transportation Needs: No  Transportation Needs (04/12/2024)   PRAPARE - Administrator, Civil Service (Medical): No    Lack of Transportation (Non-Medical): No  Physical Activity: Not on file  Stress: Not on file  Social Connections: Unknown (09/16/2021)   Received from [facility]   Social Network    Social Network: Not on file     FAMILY HISTORY:  We obtained a detailed, 4-generation family history.  Significant diagnoses are listed below: Family History  Problem Relation Age of Onset   Cancer Brother        liver cancer   Diverticulitis Brother    Cancer Maternal Grandmother        ovarian or colon cancer   Esophageal cancer Neg Hx      Significant family history includes: Maternal  grandmother with ovarian cancer and possibly colon cancer Brother with liver cancer possibly due to hepatitis  Ms. Mireles is unaware of previous family history of genetic testing for hereditary cancer risks. There is no reported Ashkenazi Jewish ancestry. There is no known consanguinity.  GENETIC COUNSELING ASSESSMENT: Ms. Rengel is a 68 y.o. female with a personal and family history of cancer which is somewhat suggestive of a hereditary cancer syndrome and predisposition to cancer given the combination of cancer. We, therefore, discussed and recommended the following at today's visit.   DISCUSSION: We discussed that, in general, most cancer is not inherited in families, but instead is sporadic or familial. Sporadic cancers occur by chance and typically happen at older ages (>50 years) as this type of cancer is caused by genetic changes acquired during an individuals lifetime. Some families have more cancers than would be expected by chance; however, the ages or types of cancer are not consistent with a known genetic mutation or known genetic mutations have been ruled out. This type of familial cancer is thought to be due to a combination of multiple genetic, environmental, hormonal, and lifestyle factors. While this combination of factors likely increases the risk of cancer, the exact source of this risk is not currently identifiable or testable.  We discussed that 5 - 10% of breast cancer is hereditary, with most cases associated with BRCA mutations.  There are other genes that can be associated with hereditary breast cancer syndromes.  These include ATM, CHEK2 and PALB2.  We discussed that testing is beneficial for several reasons including knowing how to follow individuals after completing their treatment, identifying whether potential treatment options such as PARP inhibitors would be beneficial, and understand if other family members could be at risk for cancer and allow them to undergo genetic  testing.   We reviewed the characteristics, features and inheritance patterns of hereditary cancer syndromes. We also discussed genetic testing, including the appropriate family members to test, the process of testing, insurance coverage and turn-around-time for results. We discussed the implications of a negative, positive, carrier and/or variant of uncertain significant result. Ms. Loveland  was offered a common hereditary cancer panel (36+ genes) and an expanded pan-cancer panel (70+ genes). Ms. Baria was informed of the benefits and limitations of each panel, including that expanded pan-cancer panels contain genes that do not have clear management guidelines at this point in time.  We also discussed that as the number of genes included on a panel increases, the chances of variants of uncertain significance increases. Ms. Sandusky decided to pursue genetic testing for the CancerNext-Expanded+RNAinsight gene panel.   The CancerNext-Expanded gene panel offered by [facility] and includes sequencing, rearrangement, and RNA analysis  for the following 77 genes: AIP, ALK, APC, ATM, BAP1, BARD1, BMPR1A, BRCA1, BRCA2, BRIP1, CDC73, CDH1, CDK4, CDKN1B, CDKN2A, CEBPA, CHEK2, CTNNA1, DDX41, DICER1, ETV6, FH, FLCN, GATA2, LZTR1, MAX, MBD4, MEN1, MET, MLH1, MSH2, MSH3, MSH6, MUTYH, NF1, NF2, NTHL1, PALB2, PHOX2B, PMS2, POT1, PRKAR1A, PTCH1, PTEN, RAD51C, RAD51D, RB1, RET, RPS20, RUNX1, SDHA, SDHAF2, SDHB, SDHC, SDHD, SMAD4, SMARCA4, SMARCB1, SMARCE1, STK11, SUFU, TMEM127, TP53, TSC1, TSC2, VHL, and WT1 (sequencing and deletion/duplication); AXIN2, CTNNA1, DDX41, EGFR, HOXB13, KIT, MBD4, MITF, MSH3, PDGFRA, POLD1 and POLE (sequencing only); EPCAM and GREM1 (deletion/duplication only). RNA data is routinely analyzed for use in variant interpretation for all genes.   Based on Ms. Imel personal and family history of cancer, she meets medical criteria for genetic testing. Despite that she meets criteria, she  may still have an out of pocket cost. We discussed that if her out of pocket cost for testing is over $100, the laboratory will call and confirm whether she wants to proceed with testing.  If the out of pocket cost of testing is less than $100 she will be billed by the genetic testing laboratory.   PLAN: After considering the risks, benefits, and limitations, Ms. Wesche provided informed consent to pursue genetic testing and the blood sample was sent to [facility] for analysis of the CancerNext-Expanded+RNAinsight. Results should be available within approximately 2-3 weeks' time, at which point they will be disclosed by telephone to Ms. Gendron, as will any additional recommendations warranted by these results. Ms. Gaspard will receive a summary of her genetic counseling visit and a copy of her results once available. This information will also be available in Epic.   Lastly, we encouraged Ms. Dobesh to remain in contact with cancer genetics annually so that we can continuously update the family history and inform her of any changes in cancer genetics and testing that may be of benefit for this family.   Ms. Fackler questions were answered to her satisfaction today. Our contact information was provided should additional questions or concerns arise. Thank you for the referral and allowing us  to share in the care of your patient.   Mikhayla Phillis P. Perri, MS, CGC Licensed, Patent Attorney [email or web address] phone: [phone]  I personally spent a total of 35 minutes in the care of the patient today including preparing to see the patient, getting/reviewing separately obtained history, counseling and educating, placing orders, referring and communicating with other health care professionals, and documenting clinical information in the EHR. The patient brought her sister. Drs. Lanny Stalls, and/or Gudena were available for questions, if needed..     _______________________________________________________________________ For Office Staff:  Number of people involved in session: 2 Was an Intern/ student involved with case: no

## 2024-04-12 NOTE — Progress Notes (Signed)
 [facility] Multidisciplinary Clinic Spiritual Care Note  Met with Cindy Ortiz and her sister in Breast Multidisciplinary Clinic to introduce Support Center team/resources.  [name] completed SDOH screening; results follow below.    SDOH Screenings   Food Insecurity: No Food Insecurity (04/12/2024)  Housing: Unknown (04/12/2024)  Transportation Needs: No Transportation Needs (04/12/2024)  Utilities: Not At Risk (04/12/2024)  Depression (PHQ2-9): Low Risk  (04/12/2024)  Social Connections: Unknown (09/16/2021)   Received from [facility]  Tobacco Use: Low Risk  (04/12/2024)   Chaplain and patient discussed common feelings and emotions when being diagnosed with cancer, and the importance of support during treatment.  Chaplain informed patient of the support team and support services at [facility].  Chaplain provided contact information and encouraged patient to call with any questions or concerns.  [name] reports low distress, optimistic attitude, and strong support.  Follow up needed: No. [name] plans to reach out if needs arise or circumstances change.   [address] [name], [place], [facility] Pager [phone] Voicemail [phone]

## 2024-04-14 ENCOUNTER — Other Ambulatory Visit: Payer: Self-pay | Admitting: General Surgery

## 2024-04-14 ENCOUNTER — Inpatient Hospital Stay: Admitting: Oncology

## 2024-04-14 DIAGNOSIS — C50411 Malignant neoplasm of upper-outer quadrant of right female breast: Secondary | ICD-10-CM

## 2024-04-20 ENCOUNTER — Ambulatory Visit: Payer: Self-pay | Admitting: Genetic Counselor

## 2024-04-20 ENCOUNTER — Telehealth: Payer: Self-pay | Admitting: Genetic Counselor

## 2024-04-20 ENCOUNTER — Encounter: Payer: Self-pay | Admitting: *Deleted

## 2024-04-20 ENCOUNTER — Encounter: Payer: Self-pay | Admitting: Genetic Counselor

## 2024-04-20 ENCOUNTER — Telehealth: Payer: Self-pay | Admitting: *Deleted

## 2024-04-20 DIAGNOSIS — Z1379 Encounter for other screening for genetic and chromosomal anomalies: Secondary | ICD-10-CM | POA: Insufficient documentation

## 2024-04-20 DIAGNOSIS — Z170 Estrogen receptor positive status [ER+]: Secondary | ICD-10-CM

## 2024-04-20 NOTE — Telephone Encounter (Signed)
 Spoke with patient to follow up from [facility] 12/10 and assess navigation needs. Patient states she has a research lab appt on 1/13 She thinks that having the appt and her seed appt too close together will stress her out and would like to change it  Informed her I would send a message to the research team to call  her regarding changing the appt. No other questions or concerns at this time. Encouraged her to call should anything arise.

## 2024-04-20 NOTE — Progress Notes (Signed)
 HPI:  Cindy Ortiz was previously seen in the [place] [facility] clinic due to a personal and family history of cancer and concerns regarding a hereditary predisposition to cancer. Please refer to our prior cancer genetics clinic note for more information regarding our discussion, assessment and recommendations, at the time. Cindy Ortiz recent genetic test results were disclosed to her, as were recommendations warranted by these results. These results and recommendations are discussed in more detail below.  CANCER HISTORY:  Oncology History  Malignant neoplasm of upper-outer quadrant of right breast in female, estrogen receptor positive (HCC)  04/04/2024 Cancer Staging   Staging form: Breast, AJCC 8th Edition - Clinical stage from 04/04/2024: Stage IA (cT1b, cN0, cM0, G1, ER+, PR+, HER2-) - Signed by Lanny Callander, MD on 04/11/2024 Stage prefix: Initial diagnosis Histologic grading system: 3 grade system   04/10/2024 Initial Diagnosis   Malignant neoplasm of upper-outer quadrant of right breast in female, estrogen receptor positive (HCC)   04/20/2024 Genetic Testing   Negative genetic testing. The report date is 04/19/2024.  The CancerNext-Expanded gene panel offered by [facility] and includes sequencing, rearrangement, and RNA analysis for the following 77 genes: AIP, ALK, APC, ATM, BAP1, BARD1, BMPR1A, BRCA1, BRCA2, BRIP1, CDC73, CDH1, CDK4, CDKN1B, CDKN2A, CEBPA, CHEK2, CTNNA1, DDX41, DICER1, ETV6, FH, FLCN, GATA2, LZTR1, MAX, MBD4, MEN1, MET, MLH1, MSH2, MSH3, MSH6, MUTYH, NF1, NF2, NTHL1, PALB2, PHOX2B, PMS2, POT1, PRKAR1A, PTCH1, PTEN, RAD51C, RAD51D, RB1, RET, RPS20, RUNX1, SDHA, SDHAF2, SDHB, SDHC, SDHD, SMAD4, SMARCA4, SMARCB1, SMARCE1, STK11, SUFU, TMEM127, TP53, TSC1, TSC2, VHL, and WT1 (sequencing and deletion/duplication); AXIN2, CTNNA1, DDX41, EGFR, HOXB13, KIT, MBD4, MITF, MSH3, PDGFRA, POLD1 and POLE (sequencing only); EPCAM and GREM1 (deletion/duplication only). RNA data  is routinely analyzed for use in variant interpretation for all genes.      FAMILY HISTORY:  We obtained a detailed, 4-generation family history.  Significant diagnoses are listed below: Family History  Problem Relation Age of Onset   Cancer Brother        liver cancer   Diverticulitis Brother    Cancer Maternal Grandmother        ovarian or colon cancer   Esophageal cancer Neg Hx        Significant family history includes: Maternal grandmother with ovarian cancer and possibly colon cancer Brother with liver cancer possibly due to hepatitis   Cindy Ortiz is unaware of previous family history of genetic testing for hereditary cancer risks. There is no reported Ashkenazi Jewish ancestry. There is no known consanguinity  GENETIC TEST RESULTS: Genetic testing reported out on April 19, 2024 through the CancerNext-Expanded+RNAinsight cancer panel found no pathogenic mutations. The CancerNext-Expanded gene panel offered by [facility] and includes sequencing, rearrangement, and RNA analysis for the following 77 genes: AIP, ALK, APC, ATM, BAP1, BARD1, BMPR1A, BRCA1, BRCA2, BRIP1, CDC73, CDH1, CDK4, CDKN1B, CDKN2A, CEBPA, CHEK2, CTNNA1, DDX41, DICER1, ETV6, FH, FLCN, GATA2, LZTR1, MAX, MBD4, MEN1, MET, MLH1, MSH2, MSH3, MSH6, MUTYH, NF1, NF2, NTHL1, PALB2, PHOX2B, PMS2, POT1, PRKAR1A, PTCH1, PTEN, RAD51C, RAD51D, RB1, RET, RPS20, RUNX1, SDHA, SDHAF2, SDHB, SDHC, SDHD, SMAD4, SMARCA4, SMARCB1, SMARCE1, STK11, SUFU, TMEM127, TP53, TSC1, TSC2, VHL, and WT1 (sequencing and deletion/duplication); AXIN2, CTNNA1, DDX41, EGFR, HOXB13, KIT, MBD4, MITF, MSH3, PDGFRA, POLD1 and POLE (sequencing only); EPCAM and GREM1 (deletion/duplication only). RNA data is routinely analyzed for use in variant interpretation for all genes. The test report has been scanned into EPIC and is located under the Molecular Pathology section of the Results Review tab.  A  portion of the result report is included below for  reference.     We discussed with Cindy Ortiz that because current genetic testing is not perfect, it is possible there may be a gene mutation in one of these genes that current testing cannot detect, but that chance is small.  We also discussed, that there could be another gene that has not yet been discovered, or that we have not yet tested, that is responsible for the cancer diagnoses in the family. It is also possible there is a hereditary cause for the cancer in the family that Cindy Ortiz did not inherit and therefore was not identified in her testing.  Therefore, it is important to remain in touch with cancer genetics in the future so that we can continue to offer Cindy Ortiz the most up to date genetic testing.   ADDITIONAL GENETIC TESTING: We discussed with Cindy Ortiz that her genetic testing was fairly extensive.  If there are genes identified to increase cancer risk that can be analyzed in the future, we would be happy to discuss and coordinate this testing at that time.    CANCER SCREENING RECOMMENDATIONS: Cindy Ortiz test result is considered negative (normal).  This means that we have not identified a hereditary cause for her personal and family history of cancer at this time. Most cancers happen by chance and this negative test suggests that her personal and family history of cancer may fall into this category.    Possible reasons for Cindy Ortiz negative genetic test include:  1. There may be a gene mutation in one of these genes that current testing methods cannot detect but that chance is small.  2. There could be another gene that has not yet been discovered, or that we have not yet tested, that is responsible for the cancer diagnoses in the family.  3.  There may be no hereditary risk for cancer in the family. The cancers in Cindy Ortiz and/or her family may be sporadic/familial or due to other genetic and environmental factors. 4. It is also possible there is a  hereditary cause for the cancer in the family that Cindy Ortiz did not inherit.  Therefore, it is recommended she continue to follow the cancer management and screening guidelines provided by her oncology and primary healthcare provider. An individual's cancer risk and medical management are not determined by genetic test results alone. Overall cancer risk assessment incorporates additional factors, including personal medical history, family history, and any available genetic information that may result in a personalized plan for cancer prevention and surveillance  RECOMMENDATIONS FOR FAMILY MEMBERS:   Since she did not inherit a identifiable mutation in a cancer predisposition gene included on this panel, her children could not have inherited a known mutation from her in one of these genes. Individuals in this family might be at some increased risk of developing cancer, over the general population risk, simply due to the family history of cancer.  We recommended women in this family have a yearly mammogram beginning at age 79, or 39 years younger than the earliest onset of cancer, an annual clinical breast exam, and perform monthly breast self-exams. Women in this family should also have a gynecological exam as recommended by their primary provider. All family members should be referred for colonoscopy starting at age 59, or 21 years younger than the earliest onset of cancer.  FOLLOW-UP: Lastly, we discussed with Cindy Ortiz that cancer genetics is a rapidly advancing field and it is possible  that new genetic tests will be appropriate for her and/or her family members in the future. We encouraged her to remain in contact with cancer genetics on an annual basis so we can update her personal and family histories and let her know of advances in cancer genetics that may benefit this family.   Our contact number was provided. Cindy Ortiz questions were answered to her satisfaction, and she knows she is  welcome to call us  at anytime with additional questions or concerns.   Darice Monte, MS, [facility] Licensed, Certified Genetic Counselor [email or web address]

## 2024-04-20 NOTE — Telephone Encounter (Signed)
 I contacted  Cindy Ortiz to discuss her genetic testing results. No pathogenic variants were identified in the 77 genes analyzed. Discussed that we do not know why she has breast cancer or why there is cancer in the family. It could be due to a different gene that we are not testing, or maybe our current technology may not be able to pick something up.  It will be important for her to keep in contact with genetics to keep up with whether additional testing may be needed.Detailed clinic note to follow.   The test report will be scanned into EPIC and will be located under the Molecular Pathology section of the Results Review tab.  A portion of the result report is included below for reference.

## 2024-04-20 NOTE — Telephone Encounter (Signed)
 error

## 2024-05-12 ENCOUNTER — Encounter (HOSPITAL_BASED_OUTPATIENT_CLINIC_OR_DEPARTMENT_OTHER): Payer: Self-pay | Admitting: General Surgery

## 2024-05-15 ENCOUNTER — Other Ambulatory Visit: Payer: Self-pay | Admitting: General Surgery

## 2024-05-15 DIAGNOSIS — C50411 Malignant neoplasm of upper-outer quadrant of right female breast: Secondary | ICD-10-CM

## 2024-05-16 ENCOUNTER — Inpatient Hospital Stay

## 2024-05-16 ENCOUNTER — Inpatient Hospital Stay: Attending: Internal Medicine

## 2024-05-16 ENCOUNTER — Inpatient Hospital Stay
Admission: RE | Admit: 2024-05-16 | Discharge: 2024-05-16 | Disposition: A | Source: Ambulatory Visit | Attending: General Surgery | Admitting: General Surgery

## 2024-05-16 ENCOUNTER — Ambulatory Visit
Admission: RE | Admit: 2024-05-16 | Discharge: 2024-05-16 | Disposition: A | Source: Ambulatory Visit | Attending: General Surgery | Admitting: General Surgery

## 2024-05-16 DIAGNOSIS — C50411 Malignant neoplasm of upper-outer quadrant of right female breast: Secondary | ICD-10-CM

## 2024-05-16 HISTORY — PX: BREAST BIOPSY: SHX20

## 2024-05-16 LAB — RESEARCH LABS

## 2024-05-16 MED ORDER — CHLORHEXIDINE GLUCONATE CLOTH 2 % EX PADS: 6.0000 | MEDICATED_PAD | Freq: Once | CUTANEOUS | Status: DC

## 2024-05-16 NOTE — Progress Notes (Signed)

## 2024-05-16 NOTE — Research (Unsigned)
 [employer] 2021-05 - Specimen Collection Study to Evaluate Biomarkers in Subjects with Cancer   This Nurse has reviewed this patient's inclusion and exclusion criteria as a second review and confirms Cindy Ortiz is eligible for study participation.  Patient may continue with enrollment.  Cherylyn Hoard, BSN, RN, [employer] Research Nurse II [phone] 05/16/2024 8:27 AM

## 2024-05-16 NOTE — Research (Unsigned)
 [employer] 2021-05 - Specimen Collection Study to Evaluate Biomarkers in Subjects with Cancer    Patient Cindy Ortiz was identified by Dr. Lanny as a potential candidate for the above listed study.  This Clinical Research Coordinator met with Cindy Ortiz, [id number] on 05/16/2024 in a manner and location that ensures patient privacy to discuss participation in the above listed research study.  Patient is Unaccompanied.  Patient was previously provided with informed consent documents.  Patient confirmed they have read the informed consent documents.  As outlined in the informed consent form, this Coordinator and Nylene S Erker discussed the purpose of the research study, the investigational nature of the study, study procedures and requirements for study participation, potential risks and benefits of study participation, as well as alternatives to participation.  This study is not blinded or double-blinded. The patient understands participation is voluntary and they may withdraw from study participation at any time.  This study does not involve randomization.  This study does not involve an investigational drug or device. This study does not involve a placebo. Patient understands enrollment is pending full eligibility review.   Confidentiality and how the patient's information will be used as part of study participation were discussed.  Patient was informed there is reimbursement provided for their time and effort spent on trial participation.    All questions were answered to patient's satisfaction.  The informed consent with embedded HIPAA language was reviewed page by page.  The patient's mental and emotional status is appropriate to provide informed consent, and the patient verbalizes an understanding of study participation.  Patient has agreed to participate in the above listed research study and has voluntarily signed the informed consent version 5 (Revised 04 Jan 2024) with  embedded HIPAA language, version version 5 (Revised 04 Jan 2024) on 05/16/2024 at 8:15AM.  The patient was provided with a copy of the signed informed consent form with embedded HIPAA language for their reference.  No study specific procedures were obtained prior to the signing of the informed consent document.  Approximately 25 minutes were spent with the patient reviewing the informed consent documents.  Patient was not requested to complete a Release of Information form.   Eligibility: Eligibility criteria reviewed with patient. This nurse/coordinator has reviewed this patient's inclusion and exclusion criteria and confirmed patient is eligible for study participation. Eligibility confirmed by treating investigator, Dr. Lanny, who also agrees that patient should proceed with enrollment. Patient will continue with enrollment.  Medical History: This Coordinator reviewed the medical history as reported in the patient's medical record with the participant.  In addition, the participant was asked to report any new medical conditions not previously recorded on the medical history form.   Was the current medical history form correct?   Yes Are there any new medical conditions to report?  No  Based on the review of the medical chart and the patient's responses, all reportable medical history events will be entered for study reporting purposes.    Data Collection: Patient was interviewed to collect the following information.  Does participant have a history of: High Blood Pressure   No  Has participant been diagnosed with: Coronary Artery Disease                No Myocardial Infarction                       No Congestive Heart Failure  No Peripheral Vascular Disease          No Cerebrovascular Disease              No Chronic Pulmonary Disease             No COPD (incl Emphysema,Chronic Bronchitis)    No Lupus                               No Rheumatoid Arthritis         No Rheumatoid  Disease         No  Does participant have a history of: Diabetes                  No            Has participant been diagnosed with: Dementia                         No Hemiplegia or Paraplegia No Barrett's Esophagus       No Gastric Ulcer                 No Peptic Ulcer Disease      No Mild Liver Disease           No Moderate or Severe Liver Disease  No Liver Cirrhosis                                 No Helicobacter Pylori (H. Pylori)          No Pancreatitis                                       No Renal Disease                                No Chronic Kidney Disease (CKD)   No  Ulcerative Colitis     No Crohn's Disease    No Colorectal Polyps   No Lynch Syndrome    No Hepatitis B or C     No  Is the participant currently taking a magnesium supplement?   Yes. If yes, dose and frequency? 250 mg tablet, One tablet orally once daily Indication? None (taken without medical indication) Start date? 05/04/2021 (approximately 3 years ago). She was instructed to stop the supplements last Saturday (1/10) due to scheduled surgery.  Does the participant have a personal history of cancer (greater than 5 years ago)?  No  Does the participant have a family history of cancer in 1st or 2nd degree relatives? Yes Maternal Grandmother: Ovarian Cancer or colon cancer Brother: Liver Cancer   Does the participant have history of alcohol consumption? No    Does the participant have a history of cigarette, cigar, pipe, or chewing tobacco use?  No   Blood Collection: Research blood obtained by fresh venipuncture (or Port a Cath per patient's preference). Patient tolerated well without any adverse events.  Gift Card: $50 gift card given to patient by Clinical Research Specialist, Hobert Decent, for her participation in this study.      Patient was thanked for their participation in this study.    Abelardo Jock Clinical Research Coordinator [phone] 05/17/2024

## 2024-05-17 ENCOUNTER — Telehealth: Payer: Self-pay

## 2024-05-17 DIAGNOSIS — C50411 Malignant neoplasm of upper-outer quadrant of right female breast: Secondary | ICD-10-CM

## 2024-05-17 NOTE — Telephone Encounter (Signed)
 [employer] 2021-05 - Specimen Collection Study to Evaluate Biomarkers in Subjects with Cancer    Cindy Ortiz  was contacted by phone at 12:04 PM on 05/17/2024 regarding the magnesium supplement for the above study.  Pt reported that she has been taking Magnesium 250 mg tablets, one tablet daily, for approximately 3 years, without a medical indication.   Pt was thanked for her time and reminded to contact the research team at any time with study-related questions or concerns.  Abelardo Jock Clinical Research Coordinator [phone] 05/17/2024 12:04 PM

## 2024-05-19 ENCOUNTER — Ambulatory Visit (HOSPITAL_BASED_OUTPATIENT_CLINIC_OR_DEPARTMENT_OTHER)
Admission: RE | Admit: 2024-05-19 | Discharge: 2024-05-19 | Disposition: A | Discharge status: Home / Self Care | Attending: General Surgery | Admitting: General Surgery

## 2024-05-19 ENCOUNTER — Ambulatory Visit (HOSPITAL_BASED_OUTPATIENT_CLINIC_OR_DEPARTMENT_OTHER): Admitting: Anesthesiology

## 2024-05-19 ENCOUNTER — Other Ambulatory Visit: Payer: Self-pay

## 2024-05-19 ENCOUNTER — Encounter (HOSPITAL_BASED_OUTPATIENT_CLINIC_OR_DEPARTMENT_OTHER)
Admission: RE | Disposition: A | Discharge status: Home / Self Care | Payer: Self-pay | Source: Home / Self Care | Attending: General Surgery

## 2024-05-19 ENCOUNTER — Ambulatory Visit
Admission: RE | Admit: 2024-05-19 | Discharge: 2024-05-19 | Disposition: A | Discharge status: Home / Self Care | Source: Ambulatory Visit | Attending: General Surgery | Admitting: General Surgery

## 2024-05-19 ENCOUNTER — Encounter (HOSPITAL_BASED_OUTPATIENT_CLINIC_OR_DEPARTMENT_OTHER): Payer: Self-pay | Admitting: General Surgery

## 2024-05-19 DIAGNOSIS — F419 Anxiety disorder, unspecified: Secondary | ICD-10-CM | POA: Diagnosis not present

## 2024-05-19 DIAGNOSIS — Z79890 Hormone replacement therapy (postmenopausal): Secondary | ICD-10-CM | POA: Insufficient documentation

## 2024-05-19 DIAGNOSIS — Z1732 Human epidermal growth factor receptor 2 negative status: Secondary | ICD-10-CM | POA: Diagnosis not present

## 2024-05-19 DIAGNOSIS — C50411 Malignant neoplasm of upper-outer quadrant of right female breast: Secondary | ICD-10-CM

## 2024-05-19 DIAGNOSIS — Z1721 Progesterone receptor positive status: Secondary | ICD-10-CM | POA: Insufficient documentation

## 2024-05-19 DIAGNOSIS — D0511 Intraductal carcinoma in situ of right breast: Secondary | ICD-10-CM | POA: Insufficient documentation

## 2024-05-19 DIAGNOSIS — F32A Depression, unspecified: Secondary | ICD-10-CM | POA: Insufficient documentation

## 2024-05-19 DIAGNOSIS — Z170 Estrogen receptor positive status [ER+]: Secondary | ICD-10-CM | POA: Diagnosis not present

## 2024-05-19 DIAGNOSIS — C50911 Malignant neoplasm of unspecified site of right female breast: Secondary | ICD-10-CM

## 2024-05-19 DIAGNOSIS — Z01818 Encounter for other preprocedural examination: Secondary | ICD-10-CM

## 2024-05-19 DIAGNOSIS — E039 Hypothyroidism, unspecified: Secondary | ICD-10-CM | POA: Insufficient documentation

## 2024-05-19 DIAGNOSIS — Z79899 Other long term (current) drug therapy: Secondary | ICD-10-CM | POA: Diagnosis not present

## 2024-05-19 DIAGNOSIS — K219 Gastro-esophageal reflux disease without esophagitis: Secondary | ICD-10-CM | POA: Diagnosis not present

## 2024-05-19 HISTORY — PX: BREAST LUMPECTOMY WITH RADIOACTIVE SEED LOCALIZATION: SHX6424

## 2024-05-19 MED ORDER — HYDROMORPHONE HCL 1 MG/ML IJ SOLN
INTRAMUSCULAR | Status: AC
  Filled 2024-05-19: qty 0.5

## 2024-05-19 MED ORDER — TRAMADOL HCL 50 MG PO TABS
50.0000 mg | ORAL_TABLET | Freq: Once | ORAL | Status: AC
  Administered 2024-05-19: 50 mg via ORAL

## 2024-05-19 MED ORDER — PHENYLEPHRINE HCL (PRESSORS) 10 MG/ML IV SOLN
INTRAVENOUS | Status: DC | PRN
  Administered 2024-05-19: 80 ug via INTRAVENOUS
  Administered 2024-05-19 (×2): 160 ug via INTRAVENOUS

## 2024-05-19 MED ORDER — CEFAZOLIN SODIUM-DEXTROSE 2-4 GM/100ML-% IV SOLN
INTRAVENOUS | Status: AC
  Filled 2024-05-19: qty 100

## 2024-05-19 MED ORDER — DEXAMETHASONE SOD PHOSPHATE PF 10 MG/ML IJ SOLN
INTRAMUSCULAR | Status: DC | PRN
  Administered 2024-05-19: 5 mg via INTRAVENOUS

## 2024-05-19 MED ORDER — GABAPENTIN 100 MG PO CAPS
100.0000 mg | ORAL_CAPSULE | ORAL | Status: AC
  Administered 2024-05-19: 100 mg via ORAL

## 2024-05-19 MED ORDER — LACTATED RINGERS IV SOLN: INTRAVENOUS | Status: DC | PRN

## 2024-05-19 MED ORDER — EPHEDRINE SULFATE (PRESSORS) 25 MG/5ML IV SOSY
PREFILLED_SYRINGE | INTRAVENOUS | Status: DC | PRN
  Administered 2024-05-19: 10 mg via INTRAVENOUS

## 2024-05-19 MED ORDER — DROPERIDOL 2.5 MG/ML IJ SOLN
INTRAMUSCULAR | Status: AC
  Filled 2024-05-19: qty 2

## 2024-05-19 MED ORDER — HYDROMORPHONE HCL 1 MG/ML IJ SOLN
0.2500 mg | INTRAMUSCULAR | Status: DC | PRN
  Administered 2024-05-19 (×2): 0.25 mg via INTRAVENOUS

## 2024-05-19 MED ORDER — BUPIVACAINE-EPINEPHRINE (PF) 0.25% -1:200000 IJ SOLN
INTRAMUSCULAR | Status: DC | PRN
  Administered 2024-05-19: 20 mL

## 2024-05-19 MED ORDER — OXYCODONE HCL 5 MG/5ML PO SOLN: 5.0000 mg | Freq: Once | ORAL | Status: DC | PRN

## 2024-05-19 MED ORDER — ACETAMINOPHEN 500 MG PO TABS
ORAL_TABLET | ORAL | Status: AC
  Filled 2024-05-19: qty 2

## 2024-05-19 MED ORDER — MIDAZOLAM HCL (PF) 2 MG/2ML IJ SOLN
INTRAMUSCULAR | Status: DC | PRN
  Administered 2024-05-19: 2 mg via INTRAVENOUS

## 2024-05-19 MED ORDER — TRAMADOL HCL 50 MG PO TABS
ORAL_TABLET | ORAL | Status: AC
  Filled 2024-05-19: qty 1

## 2024-05-19 MED ORDER — FENTANYL CITRATE (PF) 100 MCG/2ML IJ SOLN
INTRAMUSCULAR | Status: AC
  Filled 2024-05-19: qty 2

## 2024-05-19 MED ORDER — ACETAMINOPHEN 500 MG PO TABS
1000.0000 mg | ORAL_TABLET | ORAL | Status: AC
  Administered 2024-05-19: 1000 mg via ORAL

## 2024-05-19 MED ORDER — TRAMADOL HCL 50 MG PO TABS: 50.0000 mg | ORAL_TABLET | Freq: Four times a day (QID) | ORAL | 0 refills | Status: AC | PRN

## 2024-05-19 MED ORDER — MIDAZOLAM HCL 2 MG/2ML IJ SOLN
INTRAMUSCULAR | Status: AC
  Filled 2024-05-19: qty 2

## 2024-05-19 MED ORDER — CEFAZOLIN SODIUM-DEXTROSE 2-3 GM-%(50ML) IV SOLR
INTRAVENOUS | Status: DC | PRN
  Administered 2024-05-19: 2 g via INTRAVENOUS

## 2024-05-19 MED ORDER — LACTATED RINGERS IV SOLN: INTRAVENOUS | Status: DC

## 2024-05-19 MED ORDER — FENTANYL CITRATE (PF) 100 MCG/2ML IJ SOLN
INTRAMUSCULAR | Status: DC | PRN
  Administered 2024-05-19: 100 ug via INTRAVENOUS

## 2024-05-19 MED ORDER — GABAPENTIN 100 MG PO CAPS
ORAL_CAPSULE | ORAL | Status: AC
  Filled 2024-05-19: qty 1

## 2024-05-19 MED ORDER — ONDANSETRON HCL 4 MG/2ML IJ SOLN
INTRAMUSCULAR | Status: DC | PRN
  Administered 2024-05-19: 4 mg via INTRAVENOUS

## 2024-05-19 MED ORDER — LIDOCAINE HCL (CARDIAC) PF 100 MG/5ML IV SOSY
PREFILLED_SYRINGE | INTRAVENOUS | Status: DC | PRN
  Administered 2024-05-19: 60 mg via INTRAVENOUS

## 2024-05-19 MED ORDER — DROPERIDOL 2.5 MG/ML IJ SOLN
0.6250 mg | Freq: Once | INTRAMUSCULAR | Status: AC | PRN
  Administered 2024-05-19: 0.625 mg via INTRAVENOUS

## 2024-05-19 MED ORDER — CEFAZOLIN SODIUM-DEXTROSE 2-4 GM/100ML-% IV SOLN: 2.0000 g | INTRAVENOUS | Status: DC

## 2024-05-19 MED ORDER — PROPOFOL 10 MG/ML IV BOLUS
INTRAVENOUS | Status: DC | PRN
  Administered 2024-05-19: 100 mg via INTRAVENOUS

## 2024-05-19 MED ORDER — OXYCODONE HCL 5 MG PO TABS: 5.0000 mg | ORAL_TABLET | Freq: Once | ORAL | Status: DC | PRN

## 2024-05-19 NOTE — Anesthesia Procedure Notes (Signed)
 Procedure Name: LMA Insertion Date/Time: 05/19/2024 2:07 PM  Performed by: Burnard Rosaline HERO, CRNAPre-anesthesia Checklist: Patient identified, Emergency Drugs available, Suction available and Patient being monitored Patient Re-evaluated:Patient Re-evaluated prior to induction Oxygen Delivery Method: Circle system utilized Preoxygenation: Pre-oxygenation with 100% oxygen Induction Type: IV induction Ventilation: Mask ventilation without difficulty LMA: LMA inserted LMA Size: 4.0 Number of attempts: 1 Airway Equipment and Method: Bite block Placement Confirmation: positive ETCO2, breath sounds checked- equal and bilateral and CO2 detector Tube secured with: Tape Dental Injury: Teeth and Oropharynx as per pre-operative assessment

## 2024-05-19 NOTE — Interval H&P Note (Signed)
 History and Physical Interval Note:  05/19/2024 1:36 PM  Cindy Ortiz  has presented today for surgery, with the diagnosis of RIGHT BREAST CANCER.  The various methods of treatment have been discussed with the patient and family. After consideration of risks, benefits and other options for treatment, the patient has consented to  Procedures: BREAST LUMPECTOMY WITH RADIOACTIVE SEED LOCALIZATION (Right) as a surgical intervention.  The patient's history has been reviewed, patient examined, no change in status, stable for surgery.  I have reviewed the patient's chart and labs.  Questions were answered to the patient's satisfaction.     Deward Null III

## 2024-05-19 NOTE — Discharge Instructions (Addendum)
" °  Post Anesthesia Home Care Instructions  Activity: Get plenty of rest for the remainder of the day. A responsible individual must stay with you for 24 hours following the procedure.  For the next 24 hours, DO NOT: -Drive a car -Advertising copywriter -Drink alcoholic beverages -Take any medication unless instructed by your physician -Make any legal decisions or sign important papers.  Meals: Start with liquid foods such as gelatin or soup. Progress to regular foods as tolerated. Avoid greasy, spicy, heavy foods. If nausea and/or vomiting occur, drink only clear liquids until the nausea and/or vomiting subsides. Call your physician if vomiting continues.  Special Instructions/Symptoms: Your throat may feel dry or sore from the anesthesia or the breathing tube placed in your throat during surgery. If this causes discomfort, gargle with warm salt water. The discomfort should disappear within 24 hours.  If you had a scopolamine patch placed behind your ear for the management of post- operative nausea and/or vomiting:  1. The medication in the patch is effective for 72 hours, after which it should be removed.  Wrap patch in a tissue and discard in the trash. Wash hands thoroughly with soap and water. 2. You may remove the patch earlier than 72 hours if you experience unpleasant side effects which may include dry mouth, dizziness or visual disturbances. 3. Avoid touching the patch. Wash your hands with soap and water after contact with the patch.     Last received tylenol  at 7pm "

## 2024-05-19 NOTE — Transfer of Care (Signed)
 Immediate Anesthesia Transfer of Care Note  Patient: Cindy Ortiz  Procedure(s) Performed: BREAST LUMPECTOMY WITH RADIOACTIVE SEED LOCALIZATION (Right: Breast)  Patient Location: PACU  Anesthesia Type:General  Level of Consciousness: awake, drowsy, and patient cooperative  Airway & Oxygen Therapy: Patient Spontanous Breathing and Patient connected to face mask oxygen  Post-op Assessment: Report given to RN and Post -op Vital signs reviewed and stable  Post vital signs: Reviewed and stable  Last Vitals:  Vitals Value Taken Time  BP 126/71 05/19/24 14:51  Temp    Pulse 77 05/19/24 14:52  Resp 12 05/19/24 14:52  SpO2 99 % 05/19/24 14:52  Vitals shown include unfiled device data.  Last Pain:  Vitals:   05/19/24 1251  TempSrc: Temporal  PainSc: 0-No pain      Patients Stated Pain Goal: 1 (05/19/24 1251)  Complications: No notable events documented.

## 2024-05-19 NOTE — Op Note (Signed)
 05/19/2024  2:37 PM  2:43 PM  PATIENT:  Cindy Ortiz  69 y.o. female  PRE-OPERATIVE DIAGNOSIS:  RIGHT BREAST CANCER  POST-OPERATIVE DIAGNOSIS:  RIGHT BREAST CANCER  PROCEDURE:  Procedures: BREAST LUMPECTOMY WITH RADIOACTIVE SEED LOCALIZATION (Right)  SURGEON:  Surgeons and Role:    * Curvin Deward MOULD, MD - Primary  PHYSICIAN ASSISTANT:   ASSISTANTS: none   ANESTHESIA:   local and general  EBL:  15 mL   BLOOD ADMINISTERED:none  DRAINS: none   LOCAL MEDICATIONS USED:  MARCAINE      SPECIMEN:  Source of Specimen:  right breast tissue with additional inferior margin  DISPOSITION OF SPECIMEN:  PATHOLOGY  COUNTS:  YES  TOURNIQUET:  * No tourniquets in log *  DICTATION: .Dragon Dictation  After informed consent was obtained the patient was brought to the operating room and placed in the supine position on the operating table.  After adequate induction of general anesthesia the patient's right breast was prepped with ChloraPrep, allowed to dry, and draped in usual sterile manner.  An appropriate timeout was performed.  Previously an I-125 seed was placed in the upper central right breast to mark an area of invasive breast cancer.  The neoprobe was set to I-125 in the area of radioactivity was readily identified.  The area around this was infiltrated with quarter percent Marcaine .  A curvilinear incision was then made with a 15 blade knife along the upper edge of the areola of the right breast.  The incision was carried through the skin and subcutaneous tissue sharply with the electrocautery.  Dissection was then carried throughout the upper portion of the right breast between the breast tissue and the subcutaneous fat and skin.  The anterior margin of the specimen was at the skin.  Once the dissection was beyond the area of the cancer I then removed a circular portion of breast tissue sharply with the electrocautery around the radioactive seed while checking the area of  radioactivity frequently.  Once the specimen was removed it was oriented with the appropriate paint colors.  A specimen radiograph was obtained that showed the clip and seed to be near the center of the specimen.  I did elect to take an additional inferior margin based off the imaging.  This was also marked appropriately and all of the tissue was sent to pathology for further evaluation.  Hemostasis was achieved using the Bovie electrocautery.  The wound was irrigated with saline and infiltrated with more quarter percent Marcaine .  The cavity was marked with clips.  The deep portion of the lumpectomy cavity was then closed with layers of interrupted 3-0 Vicryl stitches.  The skin was then closed with interrupted 4-0 Monocryl subcuticular stitches.  Dermabond dressings were applied.  The patient tolerated the procedure well.  At the end of the case all needle sponge and instrument counts were correct.  The patient was then awakened and taken to recovery in stable condition.  PLAN OF CARE: Discharge to home after PACU  PATIENT DISPOSITION:  PACU - hemodynamically stable.   Delay start of Pharmacological VTE agent (>24hrs) due to surgical blood loss or risk of bleeding: not applicable

## 2024-05-19 NOTE — H&P (Signed)
 " REFERRING PHYSICIAN: Lanny Callander, MD PROVIDER: DEWARD GARNETTE NULL, MD MRN: [id number] DOB: 05-02-1956 Subjective   Chief Complaint: No chief complaint on file.  History of Present Illness: Cindy Ortiz is a 69 y.o. female who is seen today as an office consultation for evaluation of No chief complaint on file.  We are asked to see the patient in consultation by Dr. Lanny to evaluate her for a new right breast cancer. The patient is a 69 year old white female who recently went for a routine screening mammogram. At that time she was found to have a 6 mm mass in the upper portion of the right breast. The axilla looked normal. The mass was biopsied and came back as a grade 1 invasive ductal cancer that was ER and PR positive and HER2 negative with a Ki-67 of 10%. She is otherwise in pretty good health and does not smoke. She does have a history of gastric polyps  Review of Systems: A complete review of systems was obtained from the patient. I have reviewed this information and discussed as appropriate with the patient. See HPI as well for other ROS.  ROS   Medical History: Past Medical History:  Diagnosis Date  Anxiety  GERD (gastroesophageal reflux disease)  Thyroid  disease   Patient Active Problem List  Diagnosis  Malignant neoplasm of upper-outer quadrant of right breast in female, estrogen receptor positive (CMS/HHS-HCC)   Past Surgical History:  Procedure Laterality Date  ABDOMINAL HYSTERECTOMY W/ PARTIAL VAGINACTOMY 1985  OOPHORECTOMY Right 1999  esophagogastroduodensocopy 03/03/2017  PERCUTANEOUS BIOPSY BREAST W/NEEDLE LOCALIZATION 04/04/2024  CHOLECYSTECTOMY  INGUINAL HERNIA REPAIR    Allergies  Allergen Reactions  Codeine Other (See Comments) and Unknown  REACTION: Temporary blindness  REACTION: Temporary blindness Unknown  REACTION: Temporary blindness, Unknown   Current Outpatient Medications on File Prior to Visit  Medication Sig Dispense Refill  estradioL  (ESTRACE) 1 MG tablet Take 1 mg by mouth once daily  pantoprazole  (PROTONIX ) 40 MG DR tablet Take 40 mg by mouth 2 (two) times daily  SYNTHROID 75 mcg tablet Take 75 mcg by mouth once daily   No current facility-administered medications on file prior to visit.   History reviewed. No pertinent family history.   Social History   Tobacco Use  Smoking Status Never  Smokeless Tobacco Never    Social History   Socioeconomic History  Marital status: Divorced  Tobacco Use  Smoking status: Never  Smokeless tobacco: Never  Substance and Sexual Activity  Alcohol use: Never  Drug use: Never   Social Drivers of Health   Food Insecurity: No Food Insecurity (04/11/2024)  Received [facility]  Hunger Vital Sign  Within the past 12 months, you worried that your food would run out before you got the money to buy more.: Never true  Within the past 12 months, the food you bought just didn't last and you didn't have money to get more.: Never true  Transportation Needs: No Transportation Needs (04/11/2024)  Received from [facility] - Transportation  In the past 12 months, has lack of transportation kept you from medical appointments or from getting medications?: No  In the past 12 months, has lack of transportation kept you from meetings, work, or from getting things needed for daily living?: No  Received from [employer]  Social Network  Housing Stability: Unknown (04/12/2024)  Housing Stability Vital Sign  Homeless in the Last Year: No   Objective:  There were no vitals filed for this visit.  There is no height or weight on file to calculate BMI.  Physical Exam Vitals reviewed.  Constitutional:  General: She is not in acute distress. Appearance: Normal appearance.  HENT:  Head: Normocephalic and atraumatic.  Right Ear: External ear normal.  Left Ear: External ear normal.  Nose: Nose normal.  Mouth/Throat:  Mouth: Mucous membranes are moist.  Pharynx: Oropharynx  is clear.  Eyes:  General: No scleral icterus. Extraocular Movements: Extraocular movements intact.  Conjunctiva/sclera: Conjunctivae normal.  Pupils: Pupils are equal, round, and reactive to light.  Cardiovascular:  Rate and Rhythm: Normal rate and regular rhythm.  Pulses: Normal pulses.  Heart sounds: Normal heart sounds.  Pulmonary:  Effort: Pulmonary effort is normal. No respiratory distress.  Breath sounds: Normal breath sounds.  Abdominal:  General: Bowel sounds are normal.  Palpations: Abdomen is soft.  Tenderness: There is no abdominal tenderness.  Musculoskeletal:  General: No swelling, tenderness or deformity. Normal range of motion.  Cervical back: Normal range of motion and neck supple.  Skin: General: Skin is warm and dry.  Coloration: Skin is not jaundiced.  Neurological:  General: No focal deficit present.  Mental Status: She is alert and oriented to person, place, and time.  Psychiatric:  Mood and Affect: Mood normal.  Behavior: Behavior normal.     Breast: There is no palpable mass in either breast. There is no palpable axillary, supraclavicular, or cervical lymphadenopathy.  Labs, Imaging and Diagnostic Testing:  Assessment and Plan:   Diagnoses and all orders for this visit:  Malignant neoplasm of upper-outer quadrant of right breast in female, estrogen receptor positive (CMS/HHS-HCC) - [facility] Case Posting Request; Future   The patient appears to have a 6 mm cancer in the upper portion of the right breast that is low-grade with all favorable markers and clinically normal axillary nodes. I have discussed with her in detail the different options for treatment and at this point she favors breast conservation which I feel is very reasonable. She will not need a node evaluation. I have discussed with her in detail the risks and benefits of the operation as well as some of the technical aspects including use of a radioactive seed for localization and she  understands and wishes to proceed. She will meet with medical and radiation oncology to discuss adjuvant therapy. We will move forward with surgical scheduling  "

## 2024-05-19 NOTE — Anesthesia Preprocedure Evaluation (Addendum)
"                                    Anesthesia Evaluation  Patient identified by MRN, date of birth, ID band Patient awake    Reviewed: Allergy & Precautions, NPO status , Patient's Chart, lab work & pertinent test results  Airway Mallampati: II  TM Distance: >3 FB Neck ROM: Full    Dental no notable dental hx. (+) Dental Advisory Given, Teeth Intact   Pulmonary neg pulmonary ROS   Pulmonary exam normal breath sounds clear to auscultation       Cardiovascular negative cardio ROS Normal cardiovascular exam Rhythm:Regular Rate:Normal     Neuro/Psych  PSYCHIATRIC DISORDERS Anxiety Depression    negative neurological ROS     GI/Hepatic Neg liver ROS,GERD  Medicated and Controlled,,  Endo/Other  Hypothyroidism    Renal/GU negative Renal ROS     Musculoskeletal negative musculoskeletal ROS (+)    Abdominal   Peds  Hematology negative hematology ROS (+)   Anesthesia Other Findings   Reproductive/Obstetrics                              Anesthesia Physical Anesthesia Plan  ASA: 2  Anesthesia Plan: General   Post-op Pain Management: Tylenol  PO (pre-op)* and Gabapentin  PO (pre-op)*   Induction: Intravenous  PONV Risk Score and Plan: 3 and Ondansetron , Dexamethasone , Treatment may vary due to age or medical condition and Midazolam   Airway Management Planned: LMA  Additional Equipment:   Intra-op Plan:   Post-operative Plan: Extubation in OR  Informed Consent: I have reviewed the patients History and Physical, chart, labs and discussed the procedure including the risks, benefits and alternatives for the proposed anesthesia with the patient or authorized representative who has indicated his/her understanding and acceptance.     Dental advisory given  Plan Discussed with: CRNA  Anesthesia Plan Comments:          Anesthesia Quick Evaluation  "

## 2024-05-20 NOTE — Anesthesia Postprocedure Evaluation (Signed)
"   Anesthesia Post Note  Patient: Cindy Ortiz  Procedure(s) Performed: BREAST LUMPECTOMY WITH RADIOACTIVE SEED LOCALIZATION (Right: Breast)     Patient location during evaluation: PACU Anesthesia Type: General Level of consciousness: sedated and patient cooperative Pain management: pain level controlled Vital Signs Assessment: post-procedure vital signs reviewed and stable Respiratory status: spontaneous breathing Cardiovascular status: stable Anesthetic complications: no   No notable events documented.  Last Vitals:  Vitals:   05/19/24 1515 05/19/24 1605  BP: 118/74 136/76  Pulse: 73 71  Resp: (!) 21 18  Temp:  36.5 C  SpO2: 100% 95%    Last Pain:  Vitals:   05/19/24 1605  TempSrc: Temporal  PainSc:                  Norleen Pope      "

## 2024-05-21 ENCOUNTER — Encounter (HOSPITAL_BASED_OUTPATIENT_CLINIC_OR_DEPARTMENT_OTHER): Payer: Self-pay | Admitting: General Surgery

## 2024-05-24 LAB — SURGICAL PATHOLOGY

## 2024-05-25 ENCOUNTER — Encounter: Payer: Self-pay | Admitting: *Deleted

## 2024-05-25 ENCOUNTER — Telehealth: Payer: Self-pay | Admitting: *Deleted

## 2024-05-25 ENCOUNTER — Ambulatory Visit: Payer: Self-pay | Admitting: General Surgery

## 2024-05-25 NOTE — Telephone Encounter (Signed)
Ordered oncotype per Dr. Feng.  Sent requisition to pathology and exact sciences. 

## 2024-05-29 ENCOUNTER — Encounter: Admitting: Gastroenterology

## 2024-06-09 ENCOUNTER — Encounter (HOSPITAL_COMMUNITY): Payer: Self-pay
